# Patient Record
Sex: Male | Born: 1980 | Race: Black or African American | Hispanic: No | Marital: Single | Smoking: Current every day smoker
Health system: Southern US, Community
[De-identification: ages and names within clinical notes are randomized; demographics above are authoritative.]

## PROBLEM LIST (undated history)

## (undated) DIAGNOSIS — R519 Headache, unspecified: Secondary | ICD-10-CM

## (undated) DIAGNOSIS — I10 Essential (primary) hypertension: Secondary | ICD-10-CM

## (undated) DIAGNOSIS — R51 Headache: Secondary | ICD-10-CM

## (undated) DIAGNOSIS — M1990 Unspecified osteoarthritis, unspecified site: Secondary | ICD-10-CM

## (undated) HISTORY — PX: BACK SURGERY: SHX140

---
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
## 2000-04-24 ENCOUNTER — Encounter: Payer: Self-pay | Admitting: Neurosurgery

## 2000-04-24 ENCOUNTER — Ambulatory Visit (HOSPITAL_COMMUNITY): Admission: RE | Admit: 2000-04-24 | Discharge: 2000-04-24 | Payer: Self-pay | Admitting: Neurosurgery

## 2000-06-08 ENCOUNTER — Encounter: Payer: Self-pay | Admitting: Neurosurgery

## 2000-06-08 ENCOUNTER — Ambulatory Visit (HOSPITAL_COMMUNITY): Admission: RE | Admit: 2000-06-08 | Discharge: 2000-06-09 | Payer: Self-pay | Admitting: Neurosurgery

## 2000-07-21 ENCOUNTER — Encounter (HOSPITAL_COMMUNITY): Admission: RE | Admit: 2000-07-21 | Discharge: 2000-08-20 | Payer: Self-pay | Admitting: Neurosurgery

## 2000-12-10 ENCOUNTER — Encounter: Payer: Self-pay | Admitting: Neurosurgery

## 2000-12-10 ENCOUNTER — Ambulatory Visit (HOSPITAL_COMMUNITY): Admission: RE | Admit: 2000-12-10 | Discharge: 2000-12-10 | Payer: Self-pay | Admitting: Neurosurgery

## 2001-07-15 ENCOUNTER — Encounter: Payer: Self-pay | Admitting: Internal Medicine

## 2001-07-15 ENCOUNTER — Ambulatory Visit (HOSPITAL_COMMUNITY): Admission: RE | Admit: 2001-07-15 | Discharge: 2001-07-15 | Payer: Self-pay | Admitting: Internal Medicine

## 2002-09-14 ENCOUNTER — Emergency Department (HOSPITAL_COMMUNITY): Admission: EM | Admit: 2002-09-14 | Discharge: 2002-09-14 | Payer: Self-pay | Admitting: *Deleted

## 2002-09-14 ENCOUNTER — Encounter: Payer: Self-pay | Admitting: *Deleted

## 2002-11-02 ENCOUNTER — Emergency Department (HOSPITAL_COMMUNITY): Admission: EM | Admit: 2002-11-02 | Discharge: 2002-11-02 | Payer: Self-pay | Admitting: Emergency Medicine

## 2004-11-05 ENCOUNTER — Ambulatory Visit (HOSPITAL_COMMUNITY): Admission: RE | Admit: 2004-11-05 | Discharge: 2004-11-05 | Payer: Self-pay | Admitting: Internal Medicine

## 2005-06-10 ENCOUNTER — Ambulatory Visit (HOSPITAL_COMMUNITY): Admission: RE | Admit: 2005-06-10 | Discharge: 2005-06-10 | Payer: Self-pay | Admitting: Internal Medicine

## 2006-02-26 ENCOUNTER — Ambulatory Visit (HOSPITAL_COMMUNITY): Admission: RE | Admit: 2006-02-26 | Discharge: 2006-02-26 | Payer: Self-pay | Admitting: Internal Medicine

## 2006-03-19 ENCOUNTER — Ambulatory Visit (HOSPITAL_BASED_OUTPATIENT_CLINIC_OR_DEPARTMENT_OTHER): Admission: RE | Admit: 2006-03-19 | Discharge: 2006-03-19 | Payer: Self-pay | Admitting: Internal Medicine

## 2006-03-28 ENCOUNTER — Ambulatory Visit: Payer: Self-pay | Admitting: Internal Medicine

## 2009-11-26 ENCOUNTER — Ambulatory Visit (HOSPITAL_COMMUNITY): Admission: RE | Admit: 2009-11-26 | Discharge: 2009-11-26 | Payer: Self-pay | Admitting: Internal Medicine

## 2010-07-25 NOTE — Procedures (Signed)
NAME:  Bryan Robles, BARANOWSKI NO.:  [id number]   MEDICAL RECORD NO.:  [id number]          PATIENT TYPE:  OUT   LOCATION:  SLEEP CENTER                 FACILITY:  [facility]   PHYSICIAN:  Clinton D. Maple Hudson, MD, FCCP, FACPDATE OF BIRTH:  Jul 04, 1980   DATE OF STUDY:  03/19/2006                            NOCTURNAL POLYSOMNOGRAM   REFERRING PHYSICIAN:  Kingsley Callander. Ouida Sills, M.D.   INDICATION FOR STUDY:  Hypersomnia with sleep apnea.  Epworth sleepiness  score 9/24, BMI 27.1, weight 212 pounds.   MEDICATIONS:  Melatonin 5 mg which was taken for this study night.   SLEEP ARCHITECTURE:  Total sleep time 347 minutes with sleep efficiency  88%.  Stage I was 11%, stage II 69%.  Stages III and IV were absent.  REM 205 of total sleep time.  Sleep latency 22 minutes.  REM latency 153  minutes. Awake after sleep onset 27 minutes, arousal index 7.8.  Melatonin was taken at bedtime.   RESPIRATORY DATA:  Apnea/hypotonia index (AHI, RDI) 2.2 obstructive  events per hour which is within normal limits (normal range 0 to 5 per  hour).  There were two central apneas, 7 obstructive apneas, and four  hypotonias. Events were more common while supine (AHI 6.6).  REM AHI 1.8  per hour.  There were insufficient events to qualify for split protocol  CPAP titration on this study night.   OXYGEN DATA:  Moderately loud snoring with oxygen desaturation  transiently to a nadir of 86%.  Mean oxygen saturation through the study  was 95% on room air.   CARDIAC DATA:  Normal sinus rhythm.   MOVEMENT/PARASOMNIA:  Limb jerks were noted but rarely associated with  sleep disturbance.   IMPRESSION/RECOMMENDATIONS:  1. Unremarkable sleep architecture for this study environment after      taking melatonin.  2. Occasional sleep disorder breathing events, AHI 2.2 per hour      (normal range 0 to 5 per hour).  Events were more common while      supine and it may help to sleep off the flat of his back and to      treat  any nasal congestion.  Otherwise consider treating complaints      as insomnia.     Clinton D. Maple Hudson, MD, [facility], FACP  Diplomate, Biomedical engineer of Sleep Medicine  Electronically Signed    CDY/MEDQ  D:  03/28/2006 09:41:11  T:  03/28/2006 12:33:04  Job:  [id number]

## 2010-07-25 NOTE — Op Note (Signed)
[facility]  Patient:    Bryan Robles, Bryan Robles                           MRN: [id number] Proc. Date: 06/08/00 Adm. Date:  06/08/00 Attending:  Reinaldo Meeker, M.D.                           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  POSTOPERATIVE DIAGNOSIS:  Herniated disk L4-5 left.  PROCEDURE:  Left L4-5 interlaminar laminotomy for excision of herniated disk with operative microscope.  SURGEON:  Reinaldo Meeker, M.D.  ASSISTANT:  Donalee Citrin, Jr.  DESCRIPTION OF PROCEDURE:  After being placed in the prone position, the patients back was prepped and draped in the usual sterile fashion.  A localizing x-ray was taken prior to incision to identify the L4-5 level.  A midline incision was made over the spinous process of L4-L5.  Using the Bovie cautery and curet, the incision was carried down to the spinous processes. Subperiosteal dissection was then carried out along the left side of the spinous processes and lamina and the McCullough self-retaining retractor was placed for exposure.  A second x-ray was taken to confirm the approach to the L4-L5 level, and this was correct.  Using the high speed drill, the inferior one third of the L4 lamina and the medial one third of the facet joint were removed.  The drill was then used to remove the superior one third of the L5 lamina.  Residual bone and ligamentum flavum were removed in a piecemeal fashion.  The microscope was then draped, brought into the field and used for the remainder of the case.  Using microdissection technique, the lateral aspect of the thecal sac and L5 nerve root were identified.  Further coagulation was carried out down to the floor of the canal to identify the L4-5 disk which was found to be markedly herniated, directly beneath the nerve root.  After coagulating on the annulus, the annulus was incised with a 15 blade.  Using pituitary rongeurs and curets, very  thorough disk space clean out was carried out.  At the same time, great care was taken to avoid injury to the neural elements and this was successfully done.  At this point inspection was carried out in all directions for any evidence of of residual compression and none could be identified.  Large amounts of irrigation were carried out and bleeding was controlled with antibiotic coagulation and Gelfoam.  The wound was then closed using interrupted Vicryl in the muscle, fascia, subcutaneous and subcuticular tissues and staples on the skin.  A sterile dressing was then applied.  The patient was extubated and taken to the recovery room in stable condition. DD:  06/08/00 TD:  06/08/00 Job: [id number]

## 2011-05-07 ENCOUNTER — Other Ambulatory Visit (HOSPITAL_COMMUNITY): Payer: Self-pay | Admitting: Internal Medicine

## 2011-05-07 DIAGNOSIS — M5412 Radiculopathy, cervical region: Secondary | ICD-10-CM

## 2011-05-08 ENCOUNTER — Ambulatory Visit (HOSPITAL_COMMUNITY)
Admission: RE | Admit: 2011-05-08 | Discharge: 2011-05-08 | Disposition: A | Discharge status: Home / Self Care | Payer: PRIVATE HEALTH INSURANCE | Source: Ambulatory Visit | Attending: Internal Medicine | Admitting: Internal Medicine

## 2011-05-08 DIAGNOSIS — M5412 Radiculopathy, cervical region: Secondary | ICD-10-CM

## 2011-05-08 DIAGNOSIS — M542 Cervicalgia: Secondary | ICD-10-CM | POA: Insufficient documentation

## 2011-05-08 DIAGNOSIS — M25519 Pain in unspecified shoulder: Secondary | ICD-10-CM | POA: Insufficient documentation

## 2011-05-08 DIAGNOSIS — M5020 Other cervical disc displacement, unspecified cervical region: Secondary | ICD-10-CM | POA: Insufficient documentation

## 2011-08-14 ENCOUNTER — Other Ambulatory Visit (HOSPITAL_COMMUNITY): Payer: Self-pay | Admitting: Internal Medicine

## 2011-08-14 DIAGNOSIS — IMO0002 Reserved for concepts with insufficient information to code with codable children: Secondary | ICD-10-CM

## 2011-08-18 ENCOUNTER — Ambulatory Visit (HOSPITAL_COMMUNITY)
Admission: RE | Admit: 2011-08-18 | Discharge: 2011-08-18 | Disposition: A | Discharge status: Home / Self Care | Payer: PRIVATE HEALTH INSURANCE | Source: Ambulatory Visit | Attending: Internal Medicine | Admitting: Internal Medicine

## 2011-08-18 DIAGNOSIS — M47814 Spondylosis without myelopathy or radiculopathy, thoracic region: Secondary | ICD-10-CM | POA: Insufficient documentation

## 2011-08-18 DIAGNOSIS — M546 Pain in thoracic spine: Secondary | ICD-10-CM | POA: Insufficient documentation

## 2011-08-18 DIAGNOSIS — M5124 Other intervertebral disc displacement, thoracic region: Secondary | ICD-10-CM | POA: Insufficient documentation

## 2011-08-18 DIAGNOSIS — IMO0002 Reserved for concepts with insufficient information to code with codable children: Secondary | ICD-10-CM

## 2014-05-11 ENCOUNTER — Other Ambulatory Visit (HOSPITAL_COMMUNITY): Payer: Self-pay | Admitting: Internal Medicine

## 2014-05-11 DIAGNOSIS — M5416 Radiculopathy, lumbar region: Secondary | ICD-10-CM

## 2014-05-18 ENCOUNTER — Ambulatory Visit (HOSPITAL_COMMUNITY)
Admission: RE | Admit: 2014-05-18 | Discharge: 2014-05-18 | Disposition: A | Discharge status: Home / Self Care | Payer: 59 | Source: Ambulatory Visit | Attending: Internal Medicine | Admitting: Internal Medicine

## 2014-05-18 DIAGNOSIS — M5416 Radiculopathy, lumbar region: Secondary | ICD-10-CM | POA: Diagnosis present

## 2014-11-13 ENCOUNTER — Encounter (HOSPITAL_COMMUNITY)
Admission: EM | Disposition: A | Discharge status: Home / Self Care | Payer: Self-pay | Source: Home / Self Care | Attending: Emergency Medicine

## 2014-11-13 ENCOUNTER — Emergency Department (HOSPITAL_COMMUNITY): Payer: 59 | Admitting: Certified Registered"

## 2014-11-13 ENCOUNTER — Ambulatory Visit (HOSPITAL_COMMUNITY): Admit: 2014-11-13 | Payer: Self-pay

## 2014-11-13 ENCOUNTER — Observation Stay (HOSPITAL_COMMUNITY)
Admission: EM | Admit: 2014-11-13 | Discharge: 2014-11-14 | Disposition: A | Discharge status: Home / Self Care | Payer: 59 | Attending: Surgery | Admitting: Surgery

## 2014-11-13 ENCOUNTER — Encounter (HOSPITAL_COMMUNITY): Payer: Self-pay | Admitting: *Deleted

## 2014-11-13 DIAGNOSIS — Z79891 Long term (current) use of opiate analgesic: Secondary | ICD-10-CM | POA: Diagnosis not present

## 2014-11-13 DIAGNOSIS — S41111A Laceration without foreign body of right upper arm, initial encounter: Secondary | ICD-10-CM | POA: Diagnosis present

## 2014-11-13 DIAGNOSIS — Z23 Encounter for immunization: Secondary | ICD-10-CM | POA: Diagnosis not present

## 2014-11-13 DIAGNOSIS — W134XXA Fall from, out of or through window, initial encounter: Secondary | ICD-10-CM | POA: Insufficient documentation

## 2014-11-13 DIAGNOSIS — IMO0002 Reserved for concepts with insufficient information to code with codable children: Secondary | ICD-10-CM

## 2014-11-13 DIAGNOSIS — S51011A Laceration without foreign body of right elbow, initial encounter: Secondary | ICD-10-CM | POA: Diagnosis present

## 2014-11-13 DIAGNOSIS — F17210 Nicotine dependence, cigarettes, uncomplicated: Secondary | ICD-10-CM | POA: Insufficient documentation

## 2014-11-13 DIAGNOSIS — Z79899 Other long term (current) drug therapy: Secondary | ICD-10-CM | POA: Insufficient documentation

## 2014-11-13 DIAGNOSIS — S45311A Laceration of superficial vein at shoulder and upper arm level, right arm, initial encounter: Principal | ICD-10-CM | POA: Insufficient documentation

## 2014-11-13 DIAGNOSIS — S45191A Other specified injury of brachial artery, right side, initial encounter: Secondary | ICD-10-CM | POA: Diagnosis not present

## 2014-11-13 HISTORY — PX: WOUND EXPLORATION: SHX6188

## 2014-11-13 SURGERY — WOUND EXPLORATION
Anesthesia: General | Site: Arm Lower | Laterality: Right

## 2014-11-13 MED ORDER — TETANUS-DIPHTH-ACELL PERTUSSIS 5-2.5-18.5 LF-MCG/0.5 IM SUSP
0.5000 mL | INTRAMUSCULAR | Status: AC
  Administered 2014-11-14: 0.5 mL via INTRAMUSCULAR
  Filled 2014-11-13: qty 0.5

## 2014-11-13 MED ORDER — EPHEDRINE SULFATE 50 MG/ML IJ SOLN
INTRAMUSCULAR | Status: AC
  Filled 2014-11-13: qty 2

## 2014-11-13 MED ORDER — MIDAZOLAM HCL 2 MG/2ML IJ SOLN
INTRAMUSCULAR | Status: AC
  Filled 2014-11-13: qty 4

## 2014-11-13 MED ORDER — SUCCINYLCHOLINE CHLORIDE 20 MG/ML IJ SOLN
INTRAMUSCULAR | Status: DC | PRN
  Administered 2014-11-13: 120 mg via INTRAVENOUS

## 2014-11-13 MED ORDER — PHENYLEPHRINE HCL 10 MG/ML IJ SOLN
INTRAMUSCULAR | Status: DC | PRN
  Administered 2014-11-13: 120 ug via INTRAVENOUS
  Administered 2014-11-13 – 2014-11-14 (×3): 80 ug via INTRAVENOUS

## 2014-11-13 MED ORDER — FENTANYL CITRATE (PF) 100 MCG/2ML IJ SOLN
INTRAMUSCULAR | Status: AC
  Filled 2014-11-13: qty 2

## 2014-11-13 MED ORDER — CEFAZOLIN SODIUM-DEXTROSE 2-3 GM-% IV SOLR
INTRAVENOUS | Status: DC | PRN
  Administered 2014-11-13: 2 g via INTRAVENOUS

## 2014-11-13 MED ORDER — LACTATED RINGERS IV SOLN
INTRAVENOUS | Status: DC | PRN
  Administered 2014-11-13 – 2014-11-14 (×3): via INTRAVENOUS

## 2014-11-13 MED ORDER — GLYCOPYRROLATE 0.2 MG/ML IJ SOLN
INTRAMUSCULAR | Status: AC
  Filled 2014-11-13: qty 2

## 2014-11-13 MED ORDER — LIDOCAINE HCL (CARDIAC) 20 MG/ML IV SOLN
INTRAVENOUS | Status: DC | PRN
  Administered 2014-11-13: 100 mg via INTRAVENOUS

## 2014-11-13 MED ORDER — ONDANSETRON HCL 4 MG/2ML IJ SOLN
INTRAMUSCULAR | Status: AC
  Filled 2014-11-13: qty 10

## 2014-11-13 MED ORDER — THROMBIN 20000 UNITS EX KIT
PACK | CUTANEOUS | Status: AC
  Filled 2014-11-13: qty 1

## 2014-11-13 MED ORDER — FENTANYL CITRATE (PF) 100 MCG/2ML IJ SOLN
INTRAMUSCULAR | Status: DC | PRN
  Administered 2014-11-13: 100 ug via INTRAVENOUS
  Administered 2014-11-13: 50 ug via INTRAVENOUS
  Administered 2014-11-13: 100 ug via INTRAVENOUS

## 2014-11-13 MED ORDER — NEOSTIGMINE METHYLSULFATE 10 MG/10ML IV SOLN
INTRAVENOUS | Status: AC
  Filled 2014-11-13: qty 1

## 2014-11-13 MED ORDER — PROPOFOL 10 MG/ML IV BOLUS
INTRAVENOUS | Status: AC
  Filled 2014-11-13: qty 20

## 2014-11-13 MED ORDER — SODIUM CHLORIDE 0.9 % IJ SOLN
INTRAMUSCULAR | Status: AC
  Filled 2014-11-13: qty 10

## 2014-11-13 MED ORDER — ETOMIDATE 2 MG/ML IV SOLN
INTRAVENOUS | Status: AC
  Filled 2014-11-13: qty 10

## 2014-11-13 MED ORDER — THROMBIN 20000 UNITS EX SOLR
CUTANEOUS | Status: AC
  Filled 2014-11-13: qty 20000

## 2014-11-13 MED ORDER — FENTANYL CITRATE (PF) 100 MCG/2ML IJ SOLN
100.0000 ug | Freq: Once | INTRAMUSCULAR | Status: AC
  Administered 2014-11-13: 100 ug via INTRAVENOUS

## 2014-11-13 MED ORDER — LIDOCAINE HCL (CARDIAC) 20 MG/ML IV SOLN
INTRAVENOUS | Status: AC
  Filled 2014-11-13: qty 15

## 2014-11-13 MED ORDER — MIDAZOLAM HCL 5 MG/5ML IJ SOLN
INTRAMUSCULAR | Status: DC | PRN
  Administered 2014-11-13: 2 mg via INTRAVENOUS

## 2014-11-13 MED ORDER — PROPOFOL 10 MG/ML IV BOLUS
INTRAVENOUS | Status: DC | PRN
  Administered 2014-11-13: 200 mg via INTRAVENOUS

## 2014-11-13 MED ORDER — ROCURONIUM BROMIDE 50 MG/5ML IV SOLN
INTRAVENOUS | Status: AC
  Filled 2014-11-13: qty 1

## 2014-11-13 MED ORDER — FENTANYL CITRATE (PF) 250 MCG/5ML IJ SOLN
INTRAMUSCULAR | Status: AC
  Filled 2014-11-13: qty 5

## 2014-11-13 MED ORDER — SUCCINYLCHOLINE CHLORIDE 20 MG/ML IJ SOLN
INTRAMUSCULAR | Status: AC
  Filled 2014-11-13: qty 1

## 2014-11-13 SURGICAL SUPPLY — 36 items
ARMBAND PINK RESTRICT EXTREMIT (MISCELLANEOUS) ×4 IMPLANT
BANDAGE ELASTIC 4 VELCRO ST LF (GAUZE/BANDAGES/DRESSINGS) ×4 IMPLANT
BNDG GAUZE ELAST 4 BULKY (GAUZE/BANDAGES/DRESSINGS) ×4 IMPLANT
CANISTER SUCTION 2500CC (MISCELLANEOUS) ×4 IMPLANT
CATH EMB 4FR 80CM (CATHETERS) ×4 IMPLANT
CLIP TI MEDIUM 6 (CLIP) ×4 IMPLANT
CLIP TI WIDE RED SMALL 6 (CLIP) ×4 IMPLANT
DRAPE INCISE IOBAN 66X45 STRL (DRAPES) ×4 IMPLANT
ELECT REM PT RETURN 9FT ADLT (ELECTROSURGICAL) ×4
ELECTRODE REM PT RTRN 9FT ADLT (ELECTROSURGICAL) ×2 IMPLANT
GAUZE SPONGE 4X4 12PLY STRL (GAUZE/BANDAGES/DRESSINGS) ×4 IMPLANT
GEL ULTRASOUND 20GR AQUASONIC (MISCELLANEOUS) IMPLANT
GLOVE BIOGEL PI IND STRL 6.5 (GLOVE) ×2 IMPLANT
GLOVE BIOGEL PI IND STRL 7.5 (GLOVE) ×6 IMPLANT
GLOVE BIOGEL PI INDICATOR 6.5 (GLOVE) ×2
GLOVE BIOGEL PI INDICATOR 7.5 (GLOVE) ×6
GLOVE ECLIPSE 7.0 STRL STRAW (GLOVE) ×8 IMPLANT
GLOVE SURG SS PI 6.5 STRL IVOR (GLOVE) ×4 IMPLANT
GLOVE SURG SS PI 7.5 STRL IVOR (GLOVE) ×12 IMPLANT
GOWN STRL REUS W/ TWL LRG LVL3 (GOWN DISPOSABLE) ×4 IMPLANT
GOWN STRL REUS W/ TWL XL LVL3 (GOWN DISPOSABLE) ×2 IMPLANT
GOWN STRL REUS W/TWL LRG LVL3 (GOWN DISPOSABLE) ×4
GOWN STRL REUS W/TWL XL LVL3 (GOWN DISPOSABLE) ×2
HEMOSTAT SNOW SURGICEL 2X4 (HEMOSTASIS) IMPLANT
KIT BASIN OR (CUSTOM PROCEDURE TRAY) ×4 IMPLANT
KIT ROOM TURNOVER OR (KITS) ×4 IMPLANT
LIQUID BAND (GAUZE/BANDAGES/DRESSINGS) ×4 IMPLANT
NS IRRIG 1000ML POUR BTL (IV SOLUTION) ×4 IMPLANT
PACK CV ACCESS (CUSTOM PROCEDURE TRAY) ×4 IMPLANT
PAD ARMBOARD 7.5X6 YLW CONV (MISCELLANEOUS) ×8 IMPLANT
SUT PROLENE 6 0 BV (SUTURE) ×12 IMPLANT
SUT VIC AB 3-0 SH 27 (SUTURE) ×2
SUT VIC AB 3-0 SH 27X BRD (SUTURE) ×2 IMPLANT
SUT VICRYL 4-0 PS2 18IN ABS (SUTURE) IMPLANT
UNDERPAD 30X30 INCONTINENT (UNDERPADS AND DIAPERS) ×4 IMPLANT
WATER STERILE IRR 1000ML POUR (IV SOLUTION) ×4 IMPLANT

## 2014-11-13 NOTE — Anesthesia Procedure Notes (Signed)
Procedure Name: Intubation Date/Time: 11/13/2014 11:26 PM Performed by: Arlice Colt B Pre-anesthesia Checklist: Patient identified, Emergency Drugs available, Suction available, Patient being monitored and Timeout performed Patient Re-evaluated:Patient Re-evaluated prior to inductionOxygen Delivery Method: Circle system utilized Preoxygenation: Pre-oxygenation with 100% oxygen Intubation Type: IV induction and Rapid sequence Laryngoscope Size: Mac and 3 Grade View: Grade II Tube type: Oral Tube size: 7.5 mm Number of attempts: 1 Airway Equipment and Method: Stylet Placement Confirmation: ETT inserted through vocal cords under direct vision,  positive ETCO2 and breath sounds checked- equal and bilateral Secured at: 23 cm Tube secured with: Tape Dental Injury: Teeth and Oropharynx as per pre-operative assessment

## 2014-11-13 NOTE — ED Notes (Signed)
Paged on call Vascular MD through [facility].

## 2014-11-13 NOTE — ED Notes (Signed)
Labs results i-stat results given to dr Ellwood Sayers. No further orders given. Labs were incorrectly obtained or drawn

## 2014-11-13 NOTE — ED Notes (Signed)
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

## 2014-11-13 NOTE — ED Provider Notes (Signed)
CSN: [id number]     Arrival date & time 11/13/14  2130 History  This chart was scribed for Tilden Fossa, MD by Budd Palmer, ED Scribe. This patient was seen in room APA02/APA02 and the patient's care was started at 9:30 PM.    Chief Complaint  Patient presents with  . Extremity Laceration   The history is provided by the patient. No language interpreter was used.   HPI Comments: Bryan Robles is a 34 y.o. male smoker at 1 ppd who presents to the Emergency Department complaining of a laceration to the right arm sustained when he fell through a window frame about 20-30 minutes ago. 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 He notes excessive bleeding of the injury. He denies any other injuries. He reports a PMHx of muscle spasms, for which he takes medication. He notes a PSHx of back surgery. Pt states he is not currently on any blood thinners and has NKDA. He notes no other medical problems.  No past medical history on file. Past Surgical History  Procedure Laterality Date  . Back surgery     No family history on file. Social History  Substance Use Topics  . Smoking status: Current Every Day Smoker -- 1.00 packs/day  . Smokeless tobacco: Not on file  . Alcohol Use: No    Review of Systems  Skin: Positive for wound.  All other systems reviewed and are negative.   Allergies  Review of patient's allergies indicates no known allergies.  Home Medications   Prior to Admission medications   Medication Sig Start Date End Date Taking? Authorizing Provider  cyclobenzaprine (FLEXERIL) 10 MG tablet  11/09/14   Historical Provider, MD  diazepam (VALIUM) 5 MG tablet  08/28/14   Historical Provider, MD  etodolac (LODINE) 400 MG tablet  09/24/14   Historical Provider, MD  HYDROcodone-acetaminophen [facility]) 10-325 MG per tablet  11/05/14   Historical Provider, MD  losartan (COZAAR) 100 MG tablet  09/20/14   Historical Provider, MD  oxyCODONE-acetaminophen (PERCOCET)  10-325 MG per tablet  08/28/14   Historical Provider, MD  verapamil (CALAN-SR) 240 MG CR tablet  09/20/14   Historical Provider, MD   There were no vitals taken for this visit. Physical Exam  Constitutional: He is oriented to person, place, and time. He appears well-developed. He appears distressed.  HENT:  Head: Normocephalic and atraumatic.  Cardiovascular: Normal rate and regular rhythm.   Pulmonary/Chest: Effort normal. No respiratory distress.  Abdominal: Soft. There is no tenderness.  Musculoskeletal:  There is a large laceration to the right antecubital fossa. It is approximately 3-4 cm in length and triangular in shape. The wound is deep with pulsatile bleeding from the base of the wounds. 2+ radial pulse on the right upper extremity.  Neurological: He is alert and oriented to person, place, and time.  Sensation to light touch intact throughout all digits. Wiggles all digits of the right upper extremity.  Skin: Skin is warm and dry.  Psychiatric: He has a normal mood and affect. His behavior is normal.  Nursing note and vitals reviewed.   ED Course  Procedures     COORDINATION OF CARE: 10:03 PM - Discussed plans to order fentanyl and control the bleeding. Will send pt for vascular surgery via EMS. Pt advised of plan for treatment and pt agrees.  Labs Review Labs Reviewed - No data to display  Imaging Review No results found. I have personally reviewed and evaluated these images  and lab results as part of my medical decision-making.   EKG Interpretation None      CRITICAL CARE Performed by: Tilden Fossa, MD Total critical care time: 30 minutes  Critical care time was exclusive of separately billable procedures and treating other patients. Critical care was necessary to treat or prevent imminent or life-threatening deterioration. Critical care was time spent personally by me on the following activities: development of treatment plan with patient and/or surrogate as  well as nursing, discussions with consultants, evaluation of patient's response to treatment, examination of patient, obtaining history from patient or surrogate, ordering and performing treatments and interventions, ordering and review of laboratory studies, ordering and review of radiographic studies, pulse oximetry and re-evaluation of patient's condition.  MDM   Final diagnoses:  Laceration   Patient here for evaluation of arm laceration with pulsatile bleeding in the emergency department. Attempted to maintain hemostasis with blood pressure cuff without success. A second attempt was performed and hemostasis with application of tourniquet to the right upper extremity. Patient had persistent bleeding with tourniquet in place that limits full evaluation of the wound. On repeat examinations patient with recurrent pooling and pulsatile bleeding that is concerning for arterial injury despite 2+ radial pulse. Bleeding can be controlled with direct pressure to the wound.  Discussed with Dr. Myra Gianotti with vascular surgery concern for potential arterial injury given pulsatile flow and persistent bleeding. He accepts the patient in transfer.  I stat chem 8 ordered, results appear to be drawn through IV fluids and incorrect (glu 25, potassium less than 2, sodium 156, hgb 3). EMS arrived for transport at time that I stat results were available. No additional labs drawn.  I personally performed the services described in this documentation, which was scribed in my presence. The recorded information has been reviewed and is accurate.   Tilden Fossa, MD 11/14/14 [phone]

## 2014-11-13 NOTE — ED Notes (Signed)
MD at the bedside, pressure applied

## 2014-11-13 NOTE — ED Notes (Signed)
Attempted to call report  To OR but  There was no answer

## 2014-11-13 NOTE — Anesthesia Preprocedure Evaluation (Signed)
Anesthesia Evaluation  Patient identified by MRN, date of birth, ID band Patient awake  General Assessment Comment:Straight to OR c emergency bleeding from laceration to arm.  Airway Mallampati: I   Neck ROM: Full    Dental  (+) Teeth Intact   Pulmonary Current Smoker,    breath sounds clear to auscultation       Cardiovascular  Rhythm:Regular     Neuro/Psych    GI/Hepatic   Endo/Other    Renal/GU      Musculoskeletal   Abdominal   Peds  Hematology   Anesthesia Other Findings   Reproductive/Obstetrics                             Anesthesia Physical Anesthesia Plan  ASA: II and emergent  Anesthesia Plan: General   Post-op Pain Management:    Induction: Intravenous  Airway Management Planned: Oral ETT  Additional Equipment:   Intra-op Plan:   Post-operative Plan:   Informed Consent: I have reviewed the patients History and Physical, chart, labs and discussed the procedure including the risks, benefits and alternatives for the proposed anesthesia with the patient or authorized representative who has indicated his/her understanding and acceptance.   Dental advisory given  Plan Discussed with: CRNA and Surgeon  Anesthesia Plan Comments:         Anesthesia Quick Evaluation

## 2014-11-14 ENCOUNTER — Telehealth: Payer: Self-pay | Admitting: Surgery

## 2014-11-14 ENCOUNTER — Encounter (HOSPITAL_COMMUNITY): Payer: Self-pay | Admitting: Surgery

## 2014-11-14 DIAGNOSIS — S41111A Laceration without foreign body of right upper arm, initial encounter: Secondary | ICD-10-CM | POA: Diagnosis present

## 2014-11-14 LAB — BASIC METABOLIC PANEL
ANION GAP: 8 (ref 5–15)
BUN: 12 mg/dL (ref 6–20)
CALCIUM: 8.1 mg/dL — AB (ref 8.9–10.3)
CO2: 22 mmol/L (ref 22–32)
Chloride: 107 mmol/L (ref 101–111)
Creatinine, Ser: 1.11 mg/dL (ref 0.61–1.24)
Glucose, Bld: 80 mg/dL (ref 65–99)
POTASSIUM: 4.2 mmol/L (ref 3.5–5.1)
SODIUM: 137 mmol/L (ref 135–145)

## 2014-11-14 LAB — CBC
HCT: 32.6 % — ABNORMAL LOW (ref 39.0–52.0)
Hemoglobin: 10.7 g/dL — ABNORMAL LOW (ref 13.0–17.0)
MCH: 34.6 pg — ABNORMAL HIGH (ref 26.0–34.0)
MCHC: 32.8 g/dL (ref 30.0–36.0)
MCV: 105.5 fL — AB (ref 78.0–100.0)
PLATELETS: 208 10*3/uL (ref 150–400)
RBC: 3.09 MIL/uL — AB (ref 4.22–5.81)
RDW: 13.1 % (ref 11.5–15.5)
WBC: 10 10*3/uL (ref 4.0–10.5)

## 2014-11-14 LAB — POCT I-STAT 4, (NA,K, GLUC, HGB,HCT)
Glucose, Bld: 86 mg/dL (ref 65–99)
HCT: 36 % — ABNORMAL LOW (ref 39.0–52.0)
HEMOGLOBIN: 12.2 g/dL — AB (ref 13.0–17.0)
POTASSIUM: 4.4 mmol/L (ref 3.5–5.1)
SODIUM: 137 mmol/L (ref 135–145)

## 2014-11-14 LAB — TYPE AND SCREEN
ABO/RH(D): A POS
ANTIBODY SCREEN: NEGATIVE

## 2014-11-14 LAB — ABO/RH: ABO/RH(D): A POS

## 2014-11-14 MED ORDER — DEXTROSE 5 % IV SOLN
1.5000 g | Freq: Two times a day (BID) | INTRAVENOUS | Status: DC
  Filled 2014-11-14 (×2): qty 1.5

## 2014-11-14 MED ORDER — ONDANSETRON HCL 4 MG/2ML IJ SOLN: 4.0000 mg | Freq: Four times a day (QID) | INTRAMUSCULAR | Status: DC | PRN

## 2014-11-14 MED ORDER — BACITRACIN ZINC 500 UNIT/GM EX OINT
TOPICAL_OINTMENT | CUTANEOUS | Status: AC
  Filled 2014-11-14: qty 28.35

## 2014-11-14 MED ORDER — OXYCODONE HCL 10 MG PO TABS: 10.0000 mg | ORAL_TABLET | ORAL | Status: DC | PRN

## 2014-11-14 MED ORDER — ACETAMINOPHEN 325 MG RE SUPP: 325.0000 mg | RECTAL | Status: DC | PRN

## 2014-11-14 MED ORDER — POTASSIUM CHLORIDE CRYS ER 20 MEQ PO TBCR: 20.0000 meq | EXTENDED_RELEASE_TABLET | Freq: Once | ORAL | Status: DC

## 2014-11-14 MED ORDER — HYDROMORPHONE HCL 1 MG/ML IJ SOLN
INTRAMUSCULAR | Status: DC
Start: 2014-11-14 — End: 2014-11-14
  Filled 2014-11-14: qty 1

## 2014-11-14 MED ORDER — LABETALOL HCL 5 MG/ML IV SOLN: 10.0000 mg | INTRAVENOUS | Status: DC | PRN

## 2014-11-14 MED ORDER — OXYCODONE-ACETAMINOPHEN 5-325 MG PO TABS
1.0000 | ORAL_TABLET | ORAL | Status: DC | PRN
Start: 2014-11-14 — End: 2014-11-14
  Administered 2014-11-14: 2 via ORAL

## 2014-11-14 MED ORDER — CEPHALEXIN 500 MG PO CAPS: 500.0000 mg | ORAL_CAPSULE | Freq: Four times a day (QID) | ORAL | Status: DC

## 2014-11-14 MED ORDER — SENNA 8.6 MG PO TABS
1.0000 | ORAL_TABLET | Freq: Two times a day (BID) | ORAL | Status: DC
  Administered 2014-11-14: 8.6 mg via ORAL
  Filled 2014-11-14 (×3): qty 1

## 2014-11-14 MED ORDER — PROMETHAZINE HCL 25 MG/ML IJ SOLN: 6.2500 mg | INTRAMUSCULAR | Status: DC | PRN

## 2014-11-14 MED ORDER — MORPHINE SULFATE (PF) 2 MG/ML IV SOLN
2.0000 mg | INTRAVENOUS | Status: DC | PRN
Start: 2014-11-14 — End: 2014-11-14
  Filled 2014-11-14: qty 2

## 2014-11-14 MED ORDER — ACETAMINOPHEN 325 MG PO TABS: 325.0000 mg | ORAL_TABLET | ORAL | Status: DC | PRN

## 2014-11-14 MED ORDER — HYDROMORPHONE HCL 1 MG/ML IJ SOLN
0.2500 mg | INTRAMUSCULAR | Status: DC | PRN
  Administered 2014-11-14 (×4): 0.5 mg via INTRAVENOUS

## 2014-11-14 MED ORDER — GUAIFENESIN-DM 100-10 MG/5ML PO SYRP: 15.0000 mL | ORAL_SOLUTION | ORAL | Status: DC | PRN

## 2014-11-14 MED ORDER — OXYCODONE-ACETAMINOPHEN 5-325 MG PO TABS
ORAL_TABLET | ORAL | Status: DC
Start: 2014-11-14 — End: 2014-11-14
  Filled 2014-11-14: qty 2

## 2014-11-14 MED ORDER — OXYCODONE HCL 5 MG PO TABS
10.0000 mg | ORAL_TABLET | ORAL | Status: DC | PRN
  Administered 2014-11-14 (×2): 10 mg via ORAL
  Filled 2014-11-14 (×2): qty 2

## 2014-11-14 MED ORDER — CYCLOBENZAPRINE HCL 10 MG PO TABS
10.0000 mg | ORAL_TABLET | Freq: Three times a day (TID) | ORAL | Status: DC | PRN
  Filled 2014-11-14: qty 1

## 2014-11-14 MED ORDER — HYDROMORPHONE HCL 1 MG/ML IJ SOLN
INTRAMUSCULAR | Status: AC
  Filled 2014-11-14: qty 1

## 2014-11-14 MED ORDER — SODIUM CHLORIDE 0.9 % IV SOLN
INTRAVENOUS | Status: DC
  Administered 2014-11-14: 03:00:00 via INTRAVENOUS

## 2014-11-14 MED ORDER — 0.9 % SODIUM CHLORIDE (POUR BTL) OPTIME
TOPICAL | Status: DC | PRN
  Administered 2014-11-14: 1000 mL

## 2014-11-14 MED ORDER — ALUM & MAG HYDROXIDE-SIMETH 200-200-20 MG/5ML PO SUSP: 15.0000 mL | ORAL | Status: DC | PRN

## 2014-11-14 MED ORDER — METOPROLOL TARTRATE 1 MG/ML IV SOLN: 2.0000 mg | INTRAVENOUS | Status: DC | PRN

## 2014-11-14 MED ORDER — HYDRALAZINE HCL 20 MG/ML IJ SOLN: 5.0000 mg | INTRAMUSCULAR | Status: DC | PRN

## 2014-11-14 MED ORDER — PANTOPRAZOLE SODIUM 40 MG PO TBEC
40.0000 mg | DELAYED_RELEASE_TABLET | Freq: Every day | ORAL | Status: DC
  Administered 2014-11-14: 40 mg via ORAL
  Filled 2014-11-14: qty 1

## 2014-11-14 MED ORDER — VERAPAMIL HCL ER 240 MG PO TBCR
240.0000 mg | EXTENDED_RELEASE_TABLET | Freq: Every day | ORAL | Status: DC
  Filled 2014-11-14: qty 1

## 2014-11-14 MED ORDER — PHENOL 1.4 % MT LIQD: 1.0000 | OROMUCOSAL | Status: DC | PRN

## 2014-11-14 MED ORDER — MICROFIBRILLAR COLL HEMOSTAT EX PADS
MEDICATED_PAD | CUTANEOUS | Status: DC | PRN
  Administered 2014-11-14: 1 via TOPICAL

## 2014-11-14 MED ORDER — LOSARTAN POTASSIUM 50 MG PO TABS
100.0000 mg | ORAL_TABLET | Freq: Every day | ORAL | Status: DC
  Administered 2014-11-14: 100 mg via ORAL
  Filled 2014-11-14 (×2): qty 2

## 2014-11-14 MED ORDER — EPHEDRINE SULFATE 50 MG/ML IJ SOLN
INTRAMUSCULAR | Status: DC | PRN
  Administered 2014-11-14: 10 mg via INTRAVENOUS

## 2014-11-14 MED ORDER — MAGNESIUM HYDROXIDE 400 MG/5ML PO SUSP: 30.0000 mL | Freq: Every day | ORAL | Status: DC | PRN

## 2014-11-14 MED ORDER — HYDROMORPHONE HCL 1 MG/ML IJ SOLN
0.2500 mg | INTRAMUSCULAR | Status: DC | PRN
  Administered 2014-11-14 (×2): 0.5 mg via INTRAVENOUS

## 2014-11-14 MED ORDER — SODIUM CHLORIDE 0.9 % IR SOLN
Status: DC | PRN
  Administered 2014-11-14: 500 mL

## 2014-11-14 NOTE — Anesthesia Postprocedure Evaluation (Signed)
  Anesthesia Post-op Note  Patient: Bryan Robles  Procedure(s) Performed: Procedure(s): WOUND EXPLORATION of Right Arm and Ligation of superficial vein. (Right)  Patient Location: PACU  Anesthesia Type:General  Level of Consciousness: awake and sedated  Airway and Oxygen Therapy: Patient Spontanous Breathing  Post-op Pain: mild  Post-op Assessment: Post-op Vital signs reviewed              Post-op Vital Signs: stable  Last Vitals:  Filed Vitals:   11/14/14 0145  BP: 132/61  Pulse: 84  Temp: 36.8 C  Resp: 17    Complications: No apparent anesthesia complications

## 2014-11-14 NOTE — Op Note (Signed)
    Patient name: Bryan Robles MRN: [id number] DOB: 07-22-1980 Sex: male  11/13/2014 Pre-operative Diagnosis: Right brachial artery injury Post-operative diagnosis:  Superficial venous transection Surgeon:  Durene Cal Assistants:  Lianne Cure Procedure:   #1: Exploration of right arm   #2: Ligation of superficial vein  Anesthesia:  Gen. Blood Loss:  See anesthesia record Specimens:  None  Findings:  The patient had complete transection of a superficial vein which had significant bleeding with the tourniquet was let down.  This was ligated.  The brachial artery and median nerve were fully protected.  There was muscle bleeding which was controlled with cautery  Indications:  The patient presented in transfer from the [facility] emergency department secondary to a laceration injury to the right arm which was thought to be a brachial artery injury given the amount of bleeding at the scene.  The tourniquet was in place upon arrival.  He was admitted and stable.  He was taken emergently to the operating room.  Procedure:  The patient was identified in the holding area and taken to [facility] OR ROOM 11  The patient was then placed supine on the table. general anesthesia was administered.  The patient was prepped and draped in the usual sterile fashion.  A time out was called and antibiotics were administered.  A tourniquet was placed just above the existing tourniquet and inflated to 250 mmHg pressure and the outside tourniquet was removed so that the arm could be prepped properly.  Once the arm was prepped and draped sterilely I explored the wound and found a superficial vein which had been transected.  I placed a right angle clamp on the vein and then let the tourniquet down.  There was no significant bleeding.  When I removed the clamp there was fair amount of bleeding coming from this vein.  The vein was then ligated on the proximal and distal end with 2-0 silk ties.  I then explored the wound.  There  were several areas from the muscle bellies that were bleeding.  These were controlled with metal clips and cautery.  The brachial artery and median nerve were not exposed from the injury.  I used a hand-held Doppler.  The patient had excellent radial and ulnar signals.  At this point the wound was loosely reapproximated with interrupted 20 vertical mattress sutures and sterile dressings were applied.   Disposition:  To PACU in stable condition.   Juleen China, M.D. [facility] [place] Office: [phone] Pager:  [phone]

## 2014-11-14 NOTE — Telephone Encounter (Signed)
The home and cell # listed were both wrong #'s- the work phone is disconnected. I have mailed a letter in hopes of reaching him before his appt date, dpm

## 2014-11-14 NOTE — Progress Notes (Signed)
Pt claims to be an 8/10 pain but drifts off to sleep.  Will transport pt to room.

## 2014-11-14 NOTE — H&P (Signed)
         History and Physical  Patient name: Bryan Robles MRN: [id number] DOB: Oct 12, 1980 Sex: male   Reason for Admission:  Chief Complaint  Patient presents with  . Extremity Laceration    HISTORY OF PRESENT ILLNESS: This is a 34 year old gentleman who presented to the [facility] emergency department following a laceration to the right arm which was sustained when he fell through a window.  He had significant bleeding per report.  A tourniquet was applied.  [facility].  Upon arrival he did not complain of numbness or weakness in his hand.  The tourniquet was in place.  He is hemodynamically stable.  History reviewed. No pertinent past medical history.  Past Surgical History  Procedure Laterality Date  . Back surgery      Social History   Social History  . Marital Status: Married    Spouse Name: N/A  . Number of Children: N/A  . Years of Education: N/A   Occupational History  . Not on file.   Social History Main Topics  . Smoking status: Current Every Day Smoker -- 1.00 packs/day  . Smokeless tobacco: Not on file  . Alcohol Use: No  . Drug Use: No  . Sexual Activity: Not on file   Other Topics Concern  . Not on file   Social History Narrative  . No narrative on file    History reviewed. No pertinent family history.  Allergies as of 11/13/2014  . (No Known Allergies)    No current facility-administered medications on file prior to encounter.   No current outpatient prescriptions on file prior to encounter.     REVIEW OF SYSTEMS: See history of present illness, otherwise negative  PHYSICAL EXAMINATION: General: The patient appears their stated age.  Vital signs are There were no vitals taken for this visit. HEENT:  No gross abnormalities Pulmonary: Respirations are non-labored Abdomen: Soft and non-tender with. Musculoskeletal: There are no major deformities.   Neurologic: No focal weakness or paresthesias are detected, Skin: Laceration  to right arm.  Muscle was visible.  Tourniquet was in place.  Minimal bleeding with the tourniquet. Psychiatric: The patient has normal affect. Cardiovascular: There is a regular rate and rhythm without significant murmur appreciated.  Palpable right radial pulse   Assessment:  Laceration to right arm Plan: The patient has had significant blood loss per report.  This is a presumed right brachial artery injury.  He will be taken emergently to the operating room.  I discussed with the family the possibility of primary repair of his brachial artery versus interposition graft.  He does not appear to have a nerve injury at this time although assessment is somewhat limited given the fact that his tourniquet has been in place for approximately one hour.     Jorge Ny, M.D. [facility] [place] Office: [phone] Pager:  [phone]

## 2014-11-14 NOTE — Progress Notes (Signed)
Orthopedic Tech Progress Note Patient Details:  Bryan Robles Oct 25, 1980 [id number]  Ortho Devices Type of Ortho Device: Arm sling Ortho Device/Splint Location: rue Ortho Device/Splint Interventions: Application   Bryan Robles 11/14/2014, 8:16 AM

## 2014-11-14 NOTE — Transfer of Care (Signed)
Immediate Anesthesia Transfer of Care Note  Patient: Bryan Robles  Procedure(s) Performed: Procedure(s): WOUND EXPLORATION of Right Arm and Ligation of superficial vein. (Right)  Patient Location: PACU  Anesthesia Type:General  Level of Consciousness: awake, alert  and patient cooperative  Airway & Oxygen Therapy: Patient Spontanous Breathing and Patient connected to nasal cannula oxygen  Post-op Assessment: Report given to RN and Post -op Vital signs reviewed and stable  Post vital signs: Reviewed and stable  Last Vitals:  Filed Vitals:   11/14/14 0030  Temp: 36.8 C    Complications: No apparent anesthesia complications

## 2014-11-14 NOTE — Telephone Encounter (Signed)
-----   Message from Sharee Pimple, RN sent at 11/14/2014  9:04 AM EDT ----- Regarding: Schedule   ----- Message -----    From: Lars Mage, PA-C    Sent: 11/14/2014   7:00 AM      To: Vvs Charge Pool  F/u with [facility] in 1 week from [place]. Repair laceration to right arm

## 2014-11-15 NOTE — Discharge Summary (Signed)
[facility] Discharge Summary   Patient ID:  Bryan Robles MRN: [id number] DOB/AGE: May 29, 1980 34 y.o.  Admit date: 11/13/2014 Discharge date: 11/14/2014 Date of Surgery: 11/13/2014 - 11/14/2014 Surgeon: Surgeon(s): Nada Libman, MD  Admission Diagnosis: Laceration [T14.8]  Discharge Diagnoses:  Laceration [T14.8]  Secondary Diagnoses: History reviewed. No pertinent past medical history.  Procedure(s): WOUND EXPLORATION of Right Arm and Ligation of superficial vein.  Discharged Condition: good  HPI: This is a 34 year old gentleman who presented to the [facility] emergency department following a laceration to the right arm which was sustained when he fell through a window. He had significant bleeding per report. A tourniquet was applied. [facility] transferred to [facility]. Upon arrival he did not complain of numbness or weakness in his hand. The tourniquet was in place. He is hemodynamically stable.  History reviewed. No pertinent past medical history.   Hospital Course:  Bryan Robles is a 34 y.o. male is S/P { Procedure(s): WOUND EXPLORATION of Right Arm and Ligation of superficial vein. POD#1  Stable with right hand N/V/M intact CC: pain at injury site.  No active bleeding clean dry dressing placed over injury site. Plan elevation sling for comfort f/u in office in 1 week for incision check.  Discharged on keflex 500 mg QID for 7 days.   Significant Diagnostic Studies: CBC Lab Results  Component Value Date   WBC 10.0 11/14/2014   HGB 10.7* 11/14/2014   HCT 32.6* 11/14/2014   MCV 105.5* 11/14/2014   PLT 208 11/14/2014    BMET    Component Value Date/Time   NA 137 11/14/2014 0100   K 4.2 11/14/2014 0100   CL 107 11/14/2014 0100   CO2 22 11/14/2014 0100   GLUCOSE 80 11/14/2014 0100   BUN 12 11/14/2014 0100   CREATININE 1.11 11/14/2014 0100   CALCIUM 8.1* 11/14/2014 0100   GFRNONAA >60 11/14/2014 0100   GFRAA >60 11/14/2014 0100   COAG No results  found for: INR, PROTIME   Disposition:  Discharge to :Home Discharge Instructions    Call MD for:  redness, tenderness, or signs of infection (pain, swelling, bleeding, redness, odor or green/yellow discharge around incision site)    Complete by:  As directed      Call MD for:  severe or increased pain, loss or decreased feeling  in affected limb(s)    Complete by:  As directed      Call MD for:  temperature >100.5    Complete by:  As directed      Discharge instructions    Complete by:  As directed   You may wash your arm in 48 hours.  Dry dressing daily.     Driving Restrictions    Complete by:  As directed   No driving while on narcotics     Lifting restrictions    Complete by:  As directed   No lifting for 6 weeks     Resume previous diet    Complete by:  As directed             Medication List    TAKE these medications        cephALEXin 500 MG capsule  Commonly known as:  KEFLEX  Take 1 capsule (500 mg total) by mouth 4 (four) times daily.     cyclobenzaprine 10 MG tablet  Commonly known as:  FLEXERIL     diazepam 5 MG tablet  Commonly known as:  VALIUM     etodolac  400 MG tablet  Commonly known as:  LODINE     HYDROcodone-acetaminophen 10-325 MG per tablet  Commonly known as:  NORCO     losartan 100 MG tablet  Commonly known as:  COZAAR     Oxycodone HCl 10 MG Tabs  Take 1 tablet (10 mg total) by mouth every 4 (four) hours as needed (pain).     oxyCODONE-acetaminophen 10-325 MG per tablet  Commonly known as:  PERCOCET     verapamil 240 MG CR tablet  Commonly known as:  CALAN-SR       Verbal and written Discharge instructions given to the patient. Wound care per Discharge AVS     Follow-up Information    Follow up with Durene Cal, MD In 1 week.   Specialties:  Vascular Surgery, Cardiology   Why:  sent message to the office   Contact information:   [address] [place] [address] [phone]       Signed: Clinton Gallant  [facility] 11/15/2014, 9:13 AM

## 2014-11-17 ENCOUNTER — Emergency Department (HOSPITAL_COMMUNITY): Payer: 59

## 2014-11-17 ENCOUNTER — Emergency Department (HOSPITAL_COMMUNITY)
Admission: EM | Admit: 2014-11-17 | Discharge: 2014-11-17 | Disposition: A | Discharge status: Home / Self Care | Payer: 59 | Attending: Emergency Medicine | Admitting: Emergency Medicine

## 2014-11-17 ENCOUNTER — Encounter (HOSPITAL_COMMUNITY): Payer: Self-pay | Admitting: Emergency Medicine

## 2014-11-17 DIAGNOSIS — R079 Chest pain, unspecified: Secondary | ICD-10-CM | POA: Insufficient documentation

## 2014-11-17 DIAGNOSIS — Z792 Long term (current) use of antibiotics: Secondary | ICD-10-CM | POA: Insufficient documentation

## 2014-11-17 DIAGNOSIS — R51 Headache: Secondary | ICD-10-CM | POA: Diagnosis not present

## 2014-11-17 DIAGNOSIS — R0602 Shortness of breath: Secondary | ICD-10-CM | POA: Diagnosis not present

## 2014-11-17 DIAGNOSIS — R202 Paresthesia of skin: Secondary | ICD-10-CM | POA: Diagnosis not present

## 2014-11-17 DIAGNOSIS — M545 Low back pain: Secondary | ICD-10-CM | POA: Insufficient documentation

## 2014-11-17 DIAGNOSIS — Z720 Tobacco use: Secondary | ICD-10-CM | POA: Diagnosis not present

## 2014-11-17 LAB — BASIC METABOLIC PANEL
Anion gap: 8 (ref 5–15)
BUN: 14 mg/dL (ref 6–20)
CALCIUM: 8.7 mg/dL — AB (ref 8.9–10.3)
CO2: 28 mmol/L (ref 22–32)
CREATININE: 1.12 mg/dL (ref 0.61–1.24)
Chloride: 105 mmol/L (ref 101–111)
GFR calc non Af Amer: >60 mL/min (ref >60)
Glucose, Bld: 90 mg/dL (ref 65–99)
Potassium: 3.6 mmol/L (ref 3.5–5.1)
SODIUM: 141 mmol/L (ref 135–145)

## 2014-11-17 LAB — CBC WITH DIFFERENTIAL/PLATELET
BASOS ABS: 0 10*3/uL (ref 0.0–0.1)
BASOS PCT: 0 % (ref 0–1)
EOS ABS: 0.3 10*3/uL (ref 0.0–0.7)
Eosinophils Relative: 5 % (ref 0–5)
HCT: 28.8 % — ABNORMAL LOW (ref 39.0–52.0)
HEMOGLOBIN: 9.9 g/dL — AB (ref 13.0–17.0)
Lymphocytes Relative: 35 % (ref 12–46)
Lymphs Abs: 1.8 10*3/uL (ref 0.7–4.0)
MCH: 36.3 pg — ABNORMAL HIGH (ref 26.0–34.0)
MCHC: 34.4 g/dL (ref 30.0–36.0)
MCV: 105.5 fL — ABNORMAL HIGH (ref 78.0–100.0)
Monocytes Absolute: 0.3 10*3/uL (ref 0.1–1.0)
Monocytes Relative: 7 % (ref 3–12)
NEUTROS PCT: 53 % (ref 43–77)
Neutro Abs: 2.7 10*3/uL (ref 1.7–7.7)
Platelets: 237 10*3/uL (ref 150–400)
RBC: 2.73 MIL/uL — AB (ref 4.22–5.81)
RDW: 12.9 % (ref 11.5–15.5)
WBC: 5.1 10*3/uL (ref 4.0–10.5)

## 2014-11-17 LAB — D-DIMER, QUANTITATIVE: D-Dimer, Quant: 0.28 ug/mL-FEU (ref 0.00–0.48)

## 2014-11-17 NOTE — ED Provider Notes (Signed)
CSN: [id number]     Arrival date & time 11/17/14  1955 History  This chart was scribed for Vanetta Mulders, MD by Doreatha Martin, ED Scribe. This patient was seen in room APA10/APA10 and the patient's care was started at 8:40 PM.     Chief Complaint  Patient presents with  . Shortness of Breath   Patient is a 34 y.o. male presenting with shortness of breath. The history is provided by the patient. No language interpreter was used.  Shortness of Breath Severity:  Moderate Duration:  1 hour Chronicity:  New Context comment:  Recent surgery  Associated symptoms: chest pain and headaches   Associated symptoms: no abdominal pain, no cough, no fever, no rash, no sore throat and no vomiting   HPI Comments: Bryan Robles is a 34 y.o. male s/p vein ligation in the right arm (11/13/14) who presents to the Emergency Department complaining of moderate SOB onset 1 hr ago with associated tingling in the BUE and BLE, CP describes as chest tightness, HA localized to the back of the head, back pain. His surgery was done at [facility] by Dr. Foy Guadal[place]ara, and he was given return precautions. Pt states he did not receive a graft during his procedure. PCP is Dr. Ouida Sills. He denies fever, chills, rhinorrhea, cough, sore throat, visual disturbance, leg swelling, abdominal pain, diarrhea, nausea, vomiting, rash, bruising easily, dysuria, hematuria.   History reviewed. No pertinent past medical history. Past Surgical History  Procedure Laterality Date  . Back surgery    . Wound exploration Right 11/13/2014    Procedure: WOUND EXPLORATION of Right Arm and Ligation of superficial vein.;  Surgeon: Nada Libman, MD;  Location: [facility] OR;  Service: Vascular;  Laterality: Right;   History reviewed. No pertinent family history. Social History  Substance Use Topics  . Smoking status: Current Every Day Smoker -- 1.00 packs/day  . Smokeless tobacco: None  . Alcohol Use: No    Review of Systems  Constitutional: Negative for fever and  chills.  HENT: Negative for rhinorrhea and sore throat.   Eyes: Negative for visual disturbance.  Respiratory: Positive for chest tightness and shortness of breath. Negative for cough.   Cardiovascular: Positive for chest pain. Negative for leg swelling.  Gastrointestinal: Negative for nausea, vomiting, abdominal pain and diarrhea.  Genitourinary: Negative for dysuria and hematuria.  Musculoskeletal: Positive for back pain.  Skin: Negative for rash.  Neurological: Positive for headaches.       Tingling in the BUE and BLE  Hematological: Does not bruise/bleed easily.  Psychiatric/Behavioral: Negative for confusion.   Allergies  Review of patient's allergies indicates no known allergies.  Home Medications   Prior to Admission medications   Medication Sig Start Date End Date Taking? Authorizing Provider  cephALEXin (KEFLEX) 500 MG capsule Take 1 capsule (500 mg total) by mouth 4 (four) times daily. 11/14/14  Yes Lars Mage, PA-C  oxyCODONE 10 MG TABS Take 1 tablet (10 mg total) by mouth every 4 (four) hours as needed (pain). 11/14/14  Yes Lars Mage, PA-C  cyclobenzaprine (FLEXERIL) 10 MG tablet  11/09/14   Historical Provider, MD   BP 121/73 mmHg  Pulse 71  Temp(Src) 97.5 F (36.4 C) (Oral)  Resp 11  Ht 6\' 1"  (1.854 m)  Wt 188 lb (85.276 kg)  BMI 24.81 kg/m2  SpO2 99% Physical Exam  Constitutional: He is oriented to person, place, and time. He appears well-developed and well-nourished.  HENT:  Head: Normocephalic and atraumatic.  Mouth/Throat: Oropharynx  is clear and moist.  Eyes: Conjunctivae and EOM are normal. Pupils are equal, round, and reactive to light. No scleral icterus.  Sclera clear. Eyes track normal.  Neck: Normal range of motion. Neck supple.  Cardiovascular: Normal rate, regular rhythm and normal heart sounds.   No murmur heard. Pulses:      Radial pulses are 2+ on the right side, and 2+ on the left side.  Pulmonary/Chest: Effort normal and breath sounds  normal. No respiratory distress.  Lungs CTA bilaterally. O2 on RA is 98%.   Abdominal: Soft. Bowel sounds are normal. He exhibits no distension. There is no tenderness.  Musculoskeletal: Normal range of motion. He exhibits no edema.  No swelling in the ankles.  Neurological: He is alert and oriented to person, place, and time. No cranial nerve deficit. He exhibits normal muscle tone. Coordination normal.  Skin: Skin is warm and dry.  Psychiatric: He has a normal mood and affect. His behavior is normal.  Nursing note and vitals reviewed.   ED Course  Procedures (including critical care time) DIAGNOSTIC STUDIES: Oxygen Saturation is 99% on [place] 2L/min, normal by my interpretation.    COORDINATION OF CARE: 8:46 PM Discussed treatment plan with pt at bedside and pt agreed to plan.   Labs Review Labs Reviewed  BASIC METABOLIC PANEL - Abnormal; Notable for the following:    Calcium 8.7 (*)    All other components within normal limits  CBC WITH DIFFERENTIAL/PLATELET - Abnormal; Notable for the following:    RBC 2.73 (*)    Hemoglobin 9.9 (*)    HCT 28.8 (*)    MCV 105.5 (*)    MCH 36.3 (*)    All other components within normal limits  D-DIMER, QUANTITATIVE (NOT AT [facility])   Results for orders placed or performed during the hospital encounter of 11/17/14  D-dimer, quantitative (not at [facility])  Result Value Ref Range   D-Dimer, Quant 0.28 0.00 - 0.48 ug/mL-FEU  Basic metabolic panel  Result Value Ref Range   Sodium 141 135 - 145 mmol/L   Potassium 3.6 3.5 - 5.1 mmol/L   Chloride 105 101 - 111 mmol/L   CO2 28 22 - 32 mmol/L   Glucose, Bld 90 65 - 99 mg/dL   BUN 14 6 - 20 mg/dL   Creatinine, Ser 1.61 0.61 - 1.24 mg/dL   Calcium 8.7 (L) 8.9 - 10.3 mg/dL   GFR calc non Af Amer >60 >60 mL/min   GFR calc Af Amer >60 >60 mL/min   Anion gap 8 5 - 15  CBC with Differential/Platelet  Result Value Ref Range   WBC 5.1 4.0 - 10.5 K/uL   RBC 2.73 (L) 4.22 - 5.81 MIL/uL   Hemoglobin 9.9 (L)  13.0 - 17.0 g/dL   HCT 09.6 (L) 04.5 - 40.9 %   MCV 105.5 (H) 78.0 - 100.0 fL   MCH 36.3 (H) 26.0 - 34.0 pg   MCHC 34.4 30.0 - 36.0 g/dL   RDW 81.1 91.4 - 78.2 %   Platelets 237 150 - 400 K/uL   Neutrophils Relative % 53 43 - 77 %   Neutro Abs 2.7 1.7 - 7.7 K/uL   Lymphocytes Relative 35 12 - 46 %   Lymphs Abs 1.8 0.7 - 4.0 K/uL   Monocytes Relative 7 3 - 12 %   Monocytes Absolute 0.3 0.1 - 1.0 K/uL   Eosinophils Relative 5 0 - 5 %   Eosinophils Absolute 0.3 0.0 - 0.7 K/uL  Basophils Relative 0 0 - 1 %   Basophils Absolute 0.0 0.0 - 0.1 K/uL    Imaging Review Dg Chest 2 View  11/17/2014   CLINICAL DATA:  Dizziness and shortness of breath for about 30 minutes. Patient had arterial repair surgically on 11/13/2014.  EXAM: CHEST  2 VIEW  COMPARISON:  02/26/2006  FINDINGS: The heart size and mediastinal contours are within normal limits. Both lungs are clear. The visualized skeletal structures are unremarkable.  IMPRESSION: No active cardiopulmonary disease.   Electronically Signed   By: Burman Nieves M.D.   On: 11/17/2014 22:35   I have personally reviewed and evaluated these images and lab results as part of my medical decision-making.   EKG Interpretation   Date/Time:  Saturday November 17 2014 20:14:44 EDT Ventricular Rate:  95 PR Interval:  146 QRS Duration: 67 QT Interval:  339 QTC Calculation: 426 R Axis:   74 Text Interpretation:  Sinus rhythm Consider left atrial enlargement No  previous ECGs available Confirmed by Chelsi Warr  MD, Maizy Davanzo ([id number]) on  11/17/2014 8:26:26 PM      MDM   Final diagnoses:  SOB (shortness of breath)    Patient was some evidence of anemia. This is probably not acute. No explanation for the acute shortness of breath. Oxygen saturation here of been in the upper 90s. Chest x-rays negative for pneumonia pneumothorax or pulmonary edema. D-dimer is negative no evidence of pulmonary embolus based on the oxygen saturation and the d-dimer being  negative. Patient will be discharged home to follow with primary care doctor. Patient has no evidence of any acute blood loss.    I personally performed the services described in this documentation, which was scribed in my presence. The recorded information has been reviewed and is accurate.    Vanetta Mulders, MD 11/17/14 2259

## 2014-11-17 NOTE — ED Notes (Signed)
PT states understanding of care given and follow up instructions.  Informed pt to eat iron rich foods and to take a daily multivitamin to help recover from anemia due to previous blood loss.

## 2014-11-17 NOTE — ED Notes (Signed)
Pt had artery surgically repaired 11/13/14, having dizziness and shortness of breath for about 30 minutes. Told to come back to hospital if any of this occurs after surgery.

## 2014-11-17 NOTE — Discharge Instructions (Signed)
Return for any new or worse symptoms. Today's workup without any significant abnormalities. No evidence of blood clot in the lungs no evidence of pneumonia or collapsed lung.

## 2014-11-21 ENCOUNTER — Telehealth: Payer: Self-pay

## 2014-11-21 DIAGNOSIS — G8918 Other acute postprocedural pain: Secondary | ICD-10-CM

## 2014-11-21 MED ORDER — OXYCODONE HCL 10 MG PO TABS: 10.0000 mg | ORAL_TABLET | ORAL | Status: DC | PRN

## 2014-11-21 NOTE — Telephone Encounter (Signed)
Phone call from pt.  Requested refill of pain medication.  Stated the right arm incision is intact.  Reported some clear drainage.  Denied fever/ chills.  Reported numbness in the right arm that goes down to the thumb.  Rated pain at 8/10.  Follow-up appt. Is 11/28/14.  Discussed with Lianne Cure, PA.  Gave verbal okay to refill Oxycodone 10 mg, 1 tab q 6 hrs/ prn/ qty # 30; no refills.  Pt. notified that Rx will be avail. @ office for picking up.   Stated he will have his wife pick up the Rx.

## 2014-11-26 ENCOUNTER — Encounter: Payer: Self-pay | Admitting: Surgery

## 2014-11-28 ENCOUNTER — Ambulatory Visit (INDEPENDENT_AMBULATORY_CARE_PROVIDER_SITE_OTHER): Payer: Self-pay | Admitting: Surgery

## 2014-11-28 ENCOUNTER — Encounter: Payer: Self-pay | Admitting: Surgery

## 2014-11-28 ENCOUNTER — Telehealth: Payer: Self-pay | Admitting: Surgery

## 2014-11-28 VITALS — BP 137/81 | HR 97 | Temp 98.0°F | Resp 16 | Ht 73.0 in | Wt 190.0 lb

## 2014-11-28 DIAGNOSIS — G8918 Other acute postprocedural pain: Secondary | ICD-10-CM

## 2014-11-28 NOTE — Telephone Encounter (Signed)
Spoke with pt. P.T.  Bryan Robles [phone] scheduled for 12/03/14 1pm. Pt verbalized understanding. Pt has been there before.

## 2014-11-28 NOTE — Progress Notes (Signed)
Patient name: Bryan Robles MRN: [id number] DOB: 03-30-1980 Sex: male     Chief Complaint  Patient presents with  . Re-evaluation    f/u    HISTORY OF PRESENT ILLNESS: The patient is back for follow-up.  He presented with significant bleeding from his right arm after injury.  It was suspected that his brachial artery was injured but when I explored him in the operating room it was just a median cubital vein.  The artery and nerve were fully protected.  He was discharged home in back for follow-up today.  He still complains of pain in his arm and he is having trouble straightening it.  No past medical history on file.  Past Surgical History  Procedure Laterality Date  . Back surgery    . Wound exploration Right 11/13/2014    Procedure: WOUND EXPLORATION of Right Arm and Ligation of superficial vein.;  Surgeon: Nada Libman, MD;  Location: [facility] OR;  Service: Vascular;  Laterality: Right;    Social History   Social History  . Marital Status: Married    Spouse Name: N/A  . Number of Children: N/A  . Years of Education: N/A   Occupational History  . Not on file.   Social History Main Topics  . Smoking status: Current Every Day Smoker -- 1.00 packs/day  . Smokeless tobacco: Former Neurosurgeon    Quit date: 07/27/2013  . Alcohol Use: No  . Drug Use: No  . Sexual Activity: Not on file   Other Topics Concern  . Not on file   Social History Narrative    No family history on file.  Allergies as of 11/28/2014  . (No Known Allergies)    Current Outpatient Prescriptions on File Prior to Visit  Medication Sig Dispense Refill  . cyclobenzaprine (FLEXERIL) 10 MG tablet     . Oxycodone HCl 10 MG TABS Take 1 tablet (10 mg total) by mouth every 4 (four) hours as needed (pain). 30 tablet 0  . cephALEXin (KEFLEX) 500 MG capsule Take 1 capsule (500 mg total) by mouth 4 (four) times daily. (Patient not taking: Reported on 11/28/2014) 28 capsule 0   No current facility-administered  medications on file prior to visit.     REVIEW OF SYSTEMS: See history of present illness  PHYSICAL EXAMINATION:   Vital signs are  Filed Vitals:   11/28/14 1051  BP: 137/81  Pulse: 97  Temp: 98 F (36.7 C)  Resp: 16  Height:  (1.854 m)  Weight: 190 lb (86.183 kg)  SpO2: 98%   Body mass index is 25.07 kg/(m^2). General: The patient appears their stated age. HEENT:  No gross abnormalities Pulmonary:  Non labored breathing Abdomen: Soft and non-tender Musculoskeletal: He cannot straighten his arm out all the way because of pain Neurologic: No focal weakness or paresthesias are detected, Skin: There are no ulcer or rashes noted. Psychiatric: The patient has normal affect. Cardiovascular: Palpable left radial pulse incision healing  Diagnostic Studies None  Assessment: Status post trauma to left arm Plan: No arterial injury was noted at the time of his surgery and he continues to have a palpable radial pulse.  The median nerve was not exposed from the injury.  He has no neurologic deficits.  His biggest concern is pain when he tries to straighten his arm and therefore he has not straightened his arm and I feel he is developing a little contracture.  I'm giving him 40 oxycodone 5 mg tablets to  help with the pain.  I have encouraged him to do exercises to get his arm to straighten all the way and I'm also referring him to physical therapy.  He'll follow up with me again in 4 weeks  V. Charlena Cross, M.D. [facility] [place] Office: [phone] Pager:  [phone]

## 2014-12-12 ENCOUNTER — Other Ambulatory Visit: Payer: Self-pay

## 2014-12-12 DIAGNOSIS — G8918 Other acute postprocedural pain: Secondary | ICD-10-CM

## 2014-12-12 MED ORDER — OXYCODONE HCL 5 MG PO TABS: 5.0000 mg | ORAL_TABLET | Freq: Four times a day (QID) | ORAL | Status: DC | PRN

## 2014-12-12 NOTE — Progress Notes (Signed)
Pt. walked into office and reported he spoke with a nurse regarding pain medication, and was told him to come into office for a refill; no nurse, at [facility] office, spoke with pt. today, on the phone.  Rec'd Oxycodone 5 mg , #40 on 11/28/14 @ last office visit. Discussed with Dr. Edilia Bo.  Recommended to refill Oxycodone 5 mg., #20; no refills at this time.  Pt. advised that due to being one month post-op, he will not be given further narcotic pain medication refills.

## 2014-12-28 ENCOUNTER — Encounter: Payer: Self-pay | Admitting: Surgery

## 2014-12-31 ENCOUNTER — Ambulatory Visit: Payer: 59 | Admitting: Surgery

## 2014-12-31 ENCOUNTER — Ambulatory Visit (INDEPENDENT_AMBULATORY_CARE_PROVIDER_SITE_OTHER): Payer: 59 | Admitting: Surgery

## 2014-12-31 ENCOUNTER — Encounter: Payer: Self-pay | Admitting: Surgery

## 2014-12-31 VITALS — BP 141/98 | HR 98 | Ht 73.0 in | Wt 195.0 lb

## 2014-12-31 DIAGNOSIS — G8918 Other acute postprocedural pain: Secondary | ICD-10-CM

## 2014-12-31 NOTE — Progress Notes (Signed)
     Patient name: Bryan BrucknerBrad Umholtz MRN: [id number] DOB: 1980-08-06 Sex: male     Chief Complaint  Patient presents with  . Re-evaluation    4 wk f/u     HISTORY OF PRESENT ILLNESS: The patient is back for follow-up.  On 11/14/2014 he presented to the hospital with a laceration injury to the right arm which was thought to be a brachial artery injury given the amount of bleeding.  In the operating room there was transection of a superficial vein.  This was ligated.  The brachial artery and median nerve were fully protected and intact.  When I saw him several weeks ago he was having issues with a contracture in his right arm.  I sent him for physical therapy.  He has had significant improvement.  No past medical history on file.  Past Surgical History  Procedure Laterality Date  . Back surgery    . Wound exploration Right 11/13/2014    Procedure: WOUND EXPLORATION of Right Arm and Ligation of superficial vein.;  Surgeon: Nada LibmanVance W Adina Puzzo, MD;  [facility];  Service: Vascular;  Laterality: Right;    Social History   Social History  . Marital Status: Married    Spouse Name: N/A  . Number of Children: N/A  . Years of Education: N/A   Occupational History  . Not on file.   Social History Main Topics  . Smoking status: Current Every Day Smoker -- 1.00 packs/day    Types: Cigarettes  . Smokeless tobacco: Former NeurosurgeonUser    Quit date: 07/27/2013  . Alcohol Use: No  . Drug Use: No  . Sexual Activity: Not on file   Other Topics Concern  . Not on file   Social History Narrative    No family history on file.  Allergies as of 12/31/2014  . (No Known Allergies)    Current Outpatient Prescriptions on File Prior to Visit  Medication Sig Dispense Refill  . cyclobenzaprine (FLEXERIL) 10 MG tablet     . losartan (COZAAR) 100 MG tablet     . etodolac (LODINE) 400 MG tablet     . oxyCODONE (OXY IR/ROXICODONE) 5 MG immediate release tablet Take 1 tablet (5 mg total) by mouth every 6 (six)  hours as needed for severe pain. (Patient not taking: Reported on 12/31/2014) 20 tablet 0   No current facility-administered medications on file prior to visit.       PHYSICAL EXAMINATION:   Vital signs are  Filed Vitals:   12/31/14 0915 12/31/14 0918  BP: 143/91 141/98  Pulse: 98   Height: 6\' 1"  (1.854 m)   Weight: 195 lb (88.451 kg)   SpO2: 98%    Body mass index is 25.73 kg/(m^2). General: The patient appears their stated age. Full range of motion in right arm Palpable right radial pulse Incision well healed   Diagnostic Studies None  Assessment: Laceration to right arm Plan: Patient is doing very well.  His wounds are healed.  He can now straighten his right arm.  He does have a irregular appearance of his bicep muscle, possibly concerning for a tendon retraction.  I have offered him evaluation with orthopedic surgery, however he does not wish to pursue this at this time as he does not have any functional limitations.  He'll follow with me on an as-needed basis.  Jorge NyV. Wells Quinto Tippy IV, M.D. [facility] [place] Office: [phone] Pager:  [phone]

## 2015-01-04 DIAGNOSIS — Z0279 Encounter for issue of other medical certificate: Secondary | ICD-10-CM

## 2015-07-01 ENCOUNTER — Other Ambulatory Visit (HOSPITAL_COMMUNITY): Payer: Self-pay | Admitting: Internal Medicine

## 2015-07-01 DIAGNOSIS — R51 Headache: Principal | ICD-10-CM

## 2015-07-01 DIAGNOSIS — G8929 Other chronic pain: Secondary | ICD-10-CM

## 2015-07-04 ENCOUNTER — Ambulatory Visit (HOSPITAL_COMMUNITY)
Admission: RE | Admit: 2015-07-04 | Discharge: 2015-07-04 | Disposition: A | Discharge status: Home / Self Care | Payer: PRIVATE HEALTH INSURANCE | Source: Ambulatory Visit | Attending: Internal Medicine | Admitting: Internal Medicine

## 2015-07-04 ENCOUNTER — Other Ambulatory Visit (HOSPITAL_COMMUNITY): Payer: Self-pay | Admitting: Internal Medicine

## 2015-07-04 DIAGNOSIS — R51 Headache: Secondary | ICD-10-CM | POA: Insufficient documentation

## 2015-07-04 DIAGNOSIS — G8929 Other chronic pain: Secondary | ICD-10-CM

## 2015-07-04 DIAGNOSIS — J349 Unspecified disorder of nose and nasal sinuses: Secondary | ICD-10-CM | POA: Diagnosis not present

## 2015-07-30 ENCOUNTER — Other Ambulatory Visit (HOSPITAL_COMMUNITY): Payer: Self-pay | Admitting: Neurosurgery

## 2015-07-30 DIAGNOSIS — M545 Low back pain: Principal | ICD-10-CM

## 2015-07-30 DIAGNOSIS — G8929 Other chronic pain: Secondary | ICD-10-CM

## 2015-08-09 ENCOUNTER — Ambulatory Visit (HOSPITAL_COMMUNITY)
Admission: RE | Admit: 2015-08-09 | Discharge: 2015-08-09 | Disposition: A | Discharge status: Home / Self Care | Payer: PRIVATE HEALTH INSURANCE | Source: Ambulatory Visit | Attending: Neurosurgery | Admitting: Neurosurgery

## 2015-08-09 DIAGNOSIS — M545 Low back pain, unspecified: Secondary | ICD-10-CM

## 2015-08-09 DIAGNOSIS — G8929 Other chronic pain: Secondary | ICD-10-CM

## 2015-08-09 DIAGNOSIS — M5127 Other intervertebral disc displacement, lumbosacral region: Secondary | ICD-10-CM | POA: Insufficient documentation

## 2015-08-09 MED ORDER — GADOBENATE DIMEGLUMINE 529 MG/ML IV SOLN
20.0000 mL | Freq: Once | INTRAVENOUS | Status: AC | PRN
  Administered 2015-08-09: 18 mL via INTRAVENOUS

## 2015-12-30 ENCOUNTER — Other Ambulatory Visit: Payer: Self-pay | Admitting: Neurosurgery

## 2016-01-15 ENCOUNTER — Encounter (HOSPITAL_COMMUNITY)
Admission: RE | Admit: 2016-01-15 | Discharge: 2016-01-15 | Disposition: A | Discharge status: Home / Self Care | Payer: No Typology Code available for payment source | Source: Ambulatory Visit | Attending: Neurosurgery | Admitting: Neurosurgery

## 2016-01-15 ENCOUNTER — Encounter (HOSPITAL_COMMUNITY): Payer: Self-pay

## 2016-01-15 DIAGNOSIS — Z01812 Encounter for preprocedural laboratory examination: Secondary | ICD-10-CM | POA: Diagnosis present

## 2016-01-15 DIAGNOSIS — Z01810 Encounter for preprocedural cardiovascular examination: Secondary | ICD-10-CM | POA: Insufficient documentation

## 2016-01-15 HISTORY — DX: Headache: R51

## 2016-01-15 HISTORY — DX: Essential (primary) hypertension: I10

## 2016-01-15 HISTORY — DX: Headache, unspecified: R51.9

## 2016-01-15 HISTORY — DX: Unspecified osteoarthritis, unspecified site: M19.90

## 2016-01-15 LAB — BASIC METABOLIC PANEL
ANION GAP: 5 (ref 5–15)
BUN: 11 mg/dL (ref 6–20)
CALCIUM: 9.7 mg/dL (ref 8.9–10.3)
CO2: 28 mmol/L (ref 22–32)
CREATININE: 1.18 mg/dL (ref 0.61–1.24)
Chloride: 107 mmol/L (ref 101–111)
Glucose, Bld: 77 mg/dL (ref 65–99)
Potassium: 4.3 mmol/L (ref 3.5–5.1)
Sodium: 140 mmol/L (ref 135–145)

## 2016-01-15 LAB — TYPE AND SCREEN
ABO/RH(D): A POS
ANTIBODY SCREEN: NEGATIVE

## 2016-01-15 LAB — CBC
HCT: 42.5 % (ref 39.0–52.0)
HEMOGLOBIN: 14.6 g/dL (ref 13.0–17.0)
MCH: 35.3 pg — AB (ref 26.0–34.0)
MCHC: 34.4 g/dL (ref 30.0–36.0)
MCV: 102.7 fL — ABNORMAL HIGH (ref 78.0–100.0)
PLATELETS: 240 10*3/uL (ref 150–400)
RBC: 4.14 MIL/uL — AB (ref 4.22–5.81)
RDW: 12.4 % (ref 11.5–15.5)
WBC: 6.5 10*3/uL (ref 4.0–10.5)

## 2016-01-15 LAB — SURGICAL PCR SCREEN
MRSA, PCR: NEGATIVE
STAPHYLOCOCCUS AUREUS: POSITIVE — AB

## 2016-01-15 NOTE — Pre-Procedure Instructions (Addendum)
Octavia BrucknerBrad Langford  01/15/2016      [facility] - [place], [place] - [address] [place] [id number] Phone: [phone] Fax: [phone]    Your procedure is scheduled on 01/23/16.  [facility] Ad[facility] at 715 A.M.  Call this number if you have problems the morning of surgery:  [phone]   Remember:  Do not eat food or drink liquids after midnight.  Take these medicines the morning of surgery with A SIP OF WATER propanolol, verapamil,hydrocodone  STOP all herbel meds, nsaids (aleve,naproxen,advil,ibuprofen)  Starting TODAY including etodolac, vitamins, aspirin   Do not wear jewelry, make-up or nail polish.  Do not wear lotions, powders, or perfumes, or deoderant.  Do not shave 48 hours prior to surgery.  Men may shave face and neck.  Do not bring valuables to the hospital.  [facility] is not responsible for any belongings or valuables.  Contacts, dentures or bridgework may not be worn into surgery.  Leave your suitcase in the car.  After surgery it may be brought to your room.  For patients admitted to the hospital, discharge time will be determined by your treatment team.  Patients discharged the day of surgery will not be allowed to drive home.   :    Special instructions:   Special Instructions: [place] - Preparing for Surgery  Before surgery, you can play an important role.  Because skin is not sterile, your skin needs to be as free of germs as possible.  You can reduce the number of germs on you skin by washing with CHG (chlorahexidine gluconate) soap before surgery.  CHG is an antiseptic cleaner which kills germs and bonds with the skin to continue killing germs even after washing.  Please DO NOT use if you have an allergy to CHG or antibacterial soaps.  If your skin becomes reddened/irritated stop using the CHG and inform your nurse when you arrive at Short Stay.  Do not shave (including legs and  underarms) for at least 48 hours prior to the first CHG shower.  You may shave your face.  Please follow these instructions carefully:   1.  Shower with CHG Soap the night before surgery and the morning of Surgery.  2.  If you choose to wash your hair, wash your hair first as usual with your normal shampoo.  3.  After you shampoo, rinse your hair and body thoroughly to remove the Shampoo.  4.  Use CHG as you would any other liquid soap.  You can apply chg directly  to the skin and wash gently with scrungie or a clean washcloth.  5.  Apply the CHG Soap to your body ONLY FROM THE NECK DOWN.  Do not use on open wounds or open sores.  Avoid contact with your eyes ears, mouth and genitals (private parts).  Wash genitals (private parts)       with your normal soap.  6.  Wash thoroughly, paying special attention to the area where your surgery will be performed.  7.  Thoroughly rinse your body with warm water from the neck down.  8.  DO NOT shower/wash with your normal soap after using and rinsing off the CHG Soap.  9.  Pat yourself dry with a clean towel.            10.  Wear clean pajamas.            11.  Place clean  sheets on your bed the night of your first shower and do not sleep with pets.  Day of Surgery  Do not apply any lotions/deodorants the morning of surgery.  Please wear clean clothes to the hospital/surgery center.  Please read over the  fact sheets that you were given.

## 2016-01-23 ENCOUNTER — Encounter (HOSPITAL_COMMUNITY): Payer: Self-pay | Admitting: Urology

## 2016-01-23 ENCOUNTER — Inpatient Hospital Stay (HOSPITAL_COMMUNITY): Payer: No Typology Code available for payment source | Admitting: Certified Registered"

## 2016-01-23 ENCOUNTER — Encounter (HOSPITAL_COMMUNITY)
Admission: RE | Disposition: A | Discharge status: Home / Self Care | Payer: Self-pay | Source: Ambulatory Visit | Attending: Neurosurgery

## 2016-01-23 ENCOUNTER — Inpatient Hospital Stay (HOSPITAL_COMMUNITY)
Admission: RE | Admit: 2016-01-23 | Discharge: 2016-01-27 | DRG: 455 | Disposition: A | Discharge status: Home / Self Care | Payer: No Typology Code available for payment source | Source: Ambulatory Visit | Attending: Neurosurgery | Admitting: Neurosurgery

## 2016-01-23 ENCOUNTER — Inpatient Hospital Stay (HOSPITAL_COMMUNITY): Payer: No Typology Code available for payment source

## 2016-01-23 DIAGNOSIS — M48061 Spinal stenosis, lumbar region without neurogenic claudication: Secondary | ICD-10-CM | POA: Diagnosis present

## 2016-01-23 DIAGNOSIS — M549 Dorsalgia, unspecified: Secondary | ICD-10-CM | POA: Diagnosis present

## 2016-01-23 DIAGNOSIS — I10 Essential (primary) hypertension: Secondary | ICD-10-CM | POA: Diagnosis present

## 2016-01-23 DIAGNOSIS — Z79899 Other long term (current) drug therapy: Secondary | ICD-10-CM | POA: Diagnosis not present

## 2016-01-23 DIAGNOSIS — F17210 Nicotine dependence, cigarettes, uncomplicated: Secondary | ICD-10-CM | POA: Diagnosis present

## 2016-01-23 DIAGNOSIS — M4807 Spinal stenosis, lumbosacral region: Secondary | ICD-10-CM | POA: Diagnosis present

## 2016-01-23 DIAGNOSIS — M5126 Other intervertebral disc displacement, lumbar region: Secondary | ICD-10-CM | POA: Diagnosis present

## 2016-01-23 DIAGNOSIS — M5117 Intervertebral disc disorders with radiculopathy, lumbosacral region: Secondary | ICD-10-CM | POA: Diagnosis present

## 2016-01-23 DIAGNOSIS — M5116 Intervertebral disc disorders with radiculopathy, lumbar region: Secondary | ICD-10-CM | POA: Diagnosis present

## 2016-01-23 DIAGNOSIS — Z419 Encounter for procedure for purposes other than remedying health state, unspecified: Secondary | ICD-10-CM

## 2016-01-23 SURGERY — POSTERIOR LUMBAR FUSION 2 LEVEL
Anesthesia: General

## 2016-01-23 MED ORDER — THROMBIN 20000 UNITS EX SOLR
CUTANEOUS | Status: AC
  Filled 2016-01-23: qty 20000

## 2016-01-23 MED ORDER — ALUM & MAG HYDROXIDE-SIMETH 200-200-20 MG/5ML PO SUSP: 30.0000 mL | Freq: Four times a day (QID) | ORAL | Status: DC | PRN

## 2016-01-23 MED ORDER — PROPOFOL 10 MG/ML IV BOLUS
INTRAVENOUS | Status: AC
  Filled 2016-01-23: qty 20

## 2016-01-23 MED ORDER — LIDOCAINE 2% (20 MG/ML) 5 ML SYRINGE
INTRAMUSCULAR | Status: AC
  Filled 2016-01-23: qty 5

## 2016-01-23 MED ORDER — ARTIFICIAL TEARS OP OINT
TOPICAL_OINTMENT | OPHTHALMIC | Status: DC | PRN
  Administered 2016-01-23: 1 via OPHTHALMIC

## 2016-01-23 MED ORDER — FENTANYL CITRATE (PF) 100 MCG/2ML IJ SOLN
INTRAMUSCULAR | Status: AC
  Filled 2016-01-23: qty 2

## 2016-01-23 MED ORDER — LIDOCAINE-EPINEPHRINE 1 %-1:100000 IJ SOLN
INTRAMUSCULAR | Status: AC
  Filled 2016-01-23: qty 1

## 2016-01-23 MED ORDER — PROMETHAZINE HCL 25 MG/ML IJ SOLN: 6.2500 mg | INTRAMUSCULAR | Status: DC | PRN

## 2016-01-23 MED ORDER — THROMBIN 5000 UNITS EX SOLR
CUTANEOUS | Status: AC
  Filled 2016-01-23: qty 5000

## 2016-01-23 MED ORDER — ROCURONIUM BROMIDE 100 MG/10ML IV SOLN
INTRAVENOUS | Status: DC | PRN
  Administered 2016-01-23 (×2): 20 mg via INTRAVENOUS
  Administered 2016-01-23: 60 mg via INTRAVENOUS
  Administered 2016-01-23: 20 mg via INTRAVENOUS

## 2016-01-23 MED ORDER — BACITRACIN ZINC 500 UNIT/GM EX OINT
TOPICAL_OINTMENT | CUTANEOUS | Status: AC
  Filled 2016-01-23: qty 28.35

## 2016-01-23 MED ORDER — VANCOMYCIN HCL 1000 MG IV SOLR
INTRAVENOUS | Status: DC | PRN
  Administered 2016-01-23: 1000 mg

## 2016-01-23 MED ORDER — HYDROMORPHONE HCL 1 MG/ML IJ SOLN
INTRAMUSCULAR | Status: AC
  Filled 2016-01-23: qty 0.5

## 2016-01-23 MED ORDER — DOCUSATE SODIUM 100 MG PO CAPS
100.0000 mg | ORAL_CAPSULE | Freq: Two times a day (BID) | ORAL | Status: DC
  Administered 2016-01-23 – 2016-01-27 (×8): 100 mg via ORAL
  Filled 2016-01-23 (×8): qty 1

## 2016-01-23 MED ORDER — SURGIFOAM 100 EX MISC
CUTANEOUS | Status: DC | PRN
  Administered 2016-01-23: 11:00:00 via TOPICAL

## 2016-01-23 MED ORDER — DEXAMETHASONE SODIUM PHOSPHATE 10 MG/ML IJ SOLN
INTRAMUSCULAR | Status: AC
  Filled 2016-01-23: qty 1

## 2016-01-23 MED ORDER — ROCURONIUM BROMIDE 10 MG/ML (PF) SYRINGE
PREFILLED_SYRINGE | INTRAVENOUS | Status: AC
  Filled 2016-01-23: qty 10

## 2016-01-23 MED ORDER — PROPRANOLOL HCL ER 80 MG PO CP24
80.0000 mg | ORAL_CAPSULE | Freq: Every day | ORAL | Status: DC
  Administered 2016-01-24 – 2016-01-26 (×2): 80 mg via ORAL
  Filled 2016-01-23 (×4): qty 1

## 2016-01-23 MED ORDER — KETOROLAC TROMETHAMINE 0.5 % OP SOLN
1.0000 [drp] | Freq: Three times a day (TID) | OPHTHALMIC | Status: AC | PRN
  Administered 2016-01-23: 1 [drp] via OPHTHALMIC
  Filled 2016-01-23: qty 3

## 2016-01-23 MED ORDER — FENTANYL CITRATE (PF) 100 MCG/2ML IJ SOLN
INTRAMUSCULAR | Status: AC
  Filled 2016-01-23: qty 4

## 2016-01-23 MED ORDER — PROPOFOL 10 MG/ML IV BOLUS
INTRAVENOUS | Status: DC | PRN
  Administered 2016-01-23: 200 mg via INTRAVENOUS

## 2016-01-23 MED ORDER — SUGAMMADEX SODIUM 200 MG/2ML IV SOLN
INTRAVENOUS | Status: AC
  Filled 2016-01-23: qty 2

## 2016-01-23 MED ORDER — VERAPAMIL HCL ER 240 MG PO TBCR
240.0000 mg | EXTENDED_RELEASE_TABLET | Freq: Every day | ORAL | Status: DC
  Administered 2016-01-24 – 2016-01-27 (×3): 240 mg via ORAL
  Filled 2016-01-23 (×4): qty 1

## 2016-01-23 MED ORDER — SODIUM CHLORIDE 0.9 % IR SOLN
Status: DC | PRN
  Administered 2016-01-23: 11:00:00

## 2016-01-23 MED ORDER — CEFAZOLIN SODIUM-DEXTROSE 2-4 GM/100ML-% IV SOLN
2.0000 g | INTRAVENOUS | Status: AC
  Administered 2016-01-23 (×2): 2 g via INTRAVENOUS
  Filled 2016-01-23: qty 100

## 2016-01-23 MED ORDER — MIDAZOLAM HCL 2 MG/2ML IJ SOLN
INTRAMUSCULAR | Status: AC
  Filled 2016-01-23: qty 2

## 2016-01-23 MED ORDER — PHENYLEPHRINE HCL 10 MG/ML IJ SOLN
INTRAVENOUS | Status: DC | PRN
  Administered 2016-01-23: 20 ug/min via INTRAVENOUS

## 2016-01-23 MED ORDER — DIAZEPAM 5 MG PO TABS
5.0000 mg | ORAL_TABLET | Freq: Four times a day (QID) | ORAL | Status: DC | PRN
  Administered 2016-01-24 – 2016-01-26 (×6): 5 mg via ORAL
  Filled 2016-01-23 (×6): qty 1

## 2016-01-23 MED ORDER — ACETAMINOPHEN 650 MG RE SUPP: 650.0000 mg | RECTAL | Status: DC | PRN

## 2016-01-23 MED ORDER — SUCCINYLCHOLINE CHLORIDE 200 MG/10ML IV SOSY
PREFILLED_SYRINGE | INTRAVENOUS | Status: AC
  Filled 2016-01-23: qty 30

## 2016-01-23 MED ORDER — CHLORHEXIDINE GLUCONATE CLOTH 2 % EX PADS: 6.0000 | MEDICATED_PAD | Freq: Once | CUTANEOUS | Status: DC

## 2016-01-23 MED ORDER — DEXAMETHASONE SODIUM PHOSPHATE 10 MG/ML IJ SOLN
INTRAMUSCULAR | Status: DC | PRN
  Administered 2016-01-23: 10 mg via INTRAVENOUS

## 2016-01-23 MED ORDER — CEFAZOLIN SODIUM-DEXTROSE 2-4 GM/100ML-% IV SOLN
2.0000 g | Freq: Three times a day (TID) | INTRAVENOUS | Status: AC
  Administered 2016-01-23 – 2016-01-24 (×2): 2 g via INTRAVENOUS
  Filled 2016-01-23 (×2): qty 100

## 2016-01-23 MED ORDER — HYDROCODONE-ACETAMINOPHEN 10-325 MG PO TABS
1.0000 | ORAL_TABLET | Freq: Three times a day (TID) | ORAL | Status: DC | PRN
  Administered 2016-01-23 – 2016-01-25 (×2): 1 via ORAL
  Filled 2016-01-23 (×2): qty 1

## 2016-01-23 MED ORDER — OXYCODONE-ACETAMINOPHEN 5-325 MG PO TABS
1.0000 | ORAL_TABLET | ORAL | Status: DC | PRN
Start: 2016-01-23 — End: 2016-01-26
  Administered 2016-01-23 – 2016-01-26 (×11): 2 via ORAL
  Filled 2016-01-23 (×11): qty 2

## 2016-01-23 MED ORDER — BACITRACIN ZINC 500 UNIT/GM EX OINT
TOPICAL_OINTMENT | CUTANEOUS | Status: DC | PRN
  Administered 2016-01-23: 1 via TOPICAL

## 2016-01-23 MED ORDER — ACETAMINOPHEN 325 MG PO TABS: 650.0000 mg | ORAL_TABLET | ORAL | Status: DC | PRN

## 2016-01-23 MED ORDER — POLYMYXIN B-TRIMETHOPRIM 10000-0.1 UNIT/ML-% OP SOLN
1.0000 [drp] | Freq: Three times a day (TID) | OPHTHALMIC | Status: AC
  Administered 2016-01-23 – 2016-01-24 (×3): 1 [drp] via OPHTHALMIC
  Filled 2016-01-23: qty 10

## 2016-01-23 MED ORDER — EPHEDRINE SULFATE 50 MG/ML IJ SOLN
INTRAMUSCULAR | Status: DC | PRN
  Administered 2016-01-23 (×2): 10 mg via INTRAVENOUS

## 2016-01-23 MED ORDER — VANCOMYCIN HCL 1000 MG IV SOLR
INTRAVENOUS | Status: AC
  Filled 2016-01-23: qty 1000

## 2016-01-23 MED ORDER — ONDANSETRON HCL 4 MG/2ML IJ SOLN
INTRAMUSCULAR | Status: AC
  Filled 2016-01-23: qty 2

## 2016-01-23 MED ORDER — PHENYLEPHRINE 40 MCG/ML (10ML) SYRINGE FOR IV PUSH (FOR BLOOD PRESSURE SUPPORT)
PREFILLED_SYRINGE | INTRAVENOUS | Status: AC
  Filled 2016-01-23: qty 20

## 2016-01-23 MED ORDER — FENTANYL CITRATE (PF) 100 MCG/2ML IJ SOLN
INTRAMUSCULAR | Status: DC | PRN
  Administered 2016-01-23 (×7): 50 ug via INTRAVENOUS
  Administered 2016-01-23: 150 ug via INTRAVENOUS

## 2016-01-23 MED ORDER — PHENOL 1.4 % MT LIQD: 1.0000 | OROMUCOSAL | Status: DC | PRN

## 2016-01-23 MED ORDER — LIDOCAINE HCL (CARDIAC) 20 MG/ML IV SOLN
INTRAVENOUS | Status: DC | PRN
  Administered 2016-01-23: 100 mg via INTRAVENOUS

## 2016-01-23 MED ORDER — LOSARTAN POTASSIUM 50 MG PO TABS
100.0000 mg | ORAL_TABLET | Freq: Every day | ORAL | Status: DC
  Administered 2016-01-24 – 2016-01-26 (×2): 100 mg via ORAL
  Filled 2016-01-23 (×4): qty 2

## 2016-01-23 MED ORDER — BUPIVACAINE LIPOSOME 1.3 % IJ SUSP
20.0000 mL | INTRAMUSCULAR | Status: AC
  Administered 2016-01-23: 20 mL
  Filled 2016-01-23: qty 20

## 2016-01-23 MED ORDER — 0.9 % SODIUM CHLORIDE (POUR BTL) OPTIME
TOPICAL | Status: DC | PRN
  Administered 2016-01-23: 1000 mL

## 2016-01-23 MED ORDER — EPHEDRINE 5 MG/ML INJ
INTRAVENOUS | Status: AC
  Filled 2016-01-23: qty 10

## 2016-01-23 MED ORDER — MEPERIDINE HCL 25 MG/ML IJ SOLN: 6.2500 mg | INTRAMUSCULAR | Status: DC | PRN

## 2016-01-23 MED ORDER — SUGAMMADEX SODIUM 200 MG/2ML IV SOLN
INTRAVENOUS | Status: DC | PRN
  Administered 2016-01-23: 175 mg via INTRAVENOUS

## 2016-01-23 MED ORDER — HYDROMORPHONE HCL 1 MG/ML IJ SOLN
0.2500 mg | INTRAMUSCULAR | Status: DC | PRN
  Administered 2016-01-23 (×3): 0.5 mg via INTRAVENOUS

## 2016-01-23 MED ORDER — LACTATED RINGERS IV SOLN
INTRAVENOUS | Status: DC
  Administered 2016-01-23: 21:00:00 via INTRAVENOUS

## 2016-01-23 MED ORDER — ONDANSETRON HCL 4 MG/2ML IJ SOLN
INTRAMUSCULAR | Status: DC | PRN
  Administered 2016-01-23 (×2): 4 mg via INTRAVENOUS

## 2016-01-23 MED ORDER — MORPHINE SULFATE (PF) 2 MG/ML IV SOLN
1.0000 mg | INTRAVENOUS | Status: DC | PRN
  Filled 2016-01-23: qty 1
  Filled 2016-01-23: qty 2

## 2016-01-23 MED ORDER — MIDAZOLAM HCL 5 MG/5ML IJ SOLN
INTRAMUSCULAR | Status: DC | PRN
  Administered 2016-01-23: 2 mg via INTRAVENOUS

## 2016-01-23 MED ORDER — LACTATED RINGERS IV SOLN
INTRAVENOUS | Status: DC
Start: 2016-01-23 — End: 2016-01-27
  Administered 2016-01-23 (×3): via INTRAVENOUS

## 2016-01-23 MED ORDER — MENTHOL 3 MG MT LOZG: 1.0000 | LOZENGE | OROMUCOSAL | Status: DC | PRN

## 2016-01-23 MED ORDER — ONDANSETRON HCL 4 MG/2ML IJ SOLN: 4.0000 mg | INTRAMUSCULAR | Status: DC | PRN

## 2016-01-23 MED ORDER — BISACODYL 10 MG RE SUPP: 10.0000 mg | Freq: Every day | RECTAL | Status: DC | PRN

## 2016-01-23 MED ORDER — PHENYLEPHRINE HCL 10 MG/ML IJ SOLN
INTRAMUSCULAR | Status: DC | PRN
  Administered 2016-01-23: 80 ug via INTRAVENOUS
  Administered 2016-01-23 (×2): 40 ug via INTRAVENOUS
  Administered 2016-01-23: 160 ug via INTRAVENOUS
  Administered 2016-01-23: 80 ug via INTRAVENOUS

## 2016-01-23 MED ORDER — LIDOCAINE-EPINEPHRINE 1 %-1:100000 IJ SOLN
INTRAMUSCULAR | Status: DC | PRN
  Administered 2016-01-23: 10 mL

## 2016-01-23 MED ORDER — PHENYLEPHRINE HCL 10 MG/ML IJ SOLN
INTRAMUSCULAR | Status: AC
  Filled 2016-01-23: qty 2

## 2016-01-23 MED ORDER — CEFAZOLIN SODIUM 1 G IJ SOLR
INTRAMUSCULAR | Status: AC
  Filled 2016-01-23: qty 20

## 2016-01-23 MED ORDER — BSS IO SOLN
15.0000 mL | Freq: Once | INTRAOCULAR | Status: AC
  Administered 2016-01-23: 15 mL
  Filled 2016-01-23: qty 15

## 2016-01-23 SURGICAL SUPPLY — 72 items
BAG DECANTER FOR FLEXI CONT (MISCELLANEOUS) ×3 IMPLANT
BASKET BONE COLLECTION (BASKET) ×3 IMPLANT
BENZOIN TINCTURE PRP APPL 2/3 (GAUZE/BANDAGES/DRESSINGS) ×3 IMPLANT
BLADE CLIPPER SURG (BLADE) IMPLANT
BUR MATCHSTICK NEURO 3.0 LAGG (BURR) ×3 IMPLANT
BUR PRECISION FLUTE 6.0 (BURR) ×3 IMPLANT
CAGE ALTERA 10X31X9-13 15D (Cage) ×2 IMPLANT
CAGE ALTERA 9-13-15-31MM (Cage) ×1 IMPLANT
CANISTER SUCT 3000ML PPV (MISCELLANEOUS) ×3 IMPLANT
CAP REVERE LOCKING (Cap) ×18 IMPLANT
CARTRIDGE OIL MAESTRO DRILL (MISCELLANEOUS) ×1 IMPLANT
CLOSURE WOUND 1/2 X4 (GAUZE/BANDAGES/DRESSINGS) ×1
CONT SPEC 4OZ CLIKSEAL STRL BL (MISCELLANEOUS) ×3 IMPLANT
CORDS BIPOLAR (ELECTRODE) ×3 IMPLANT
COVER BACK TABLE 60X90IN (DRAPES) ×3 IMPLANT
COVER TABLE BACK 60X90 (DRAPES) ×3 IMPLANT
DIFFUSER DRILL AIR PNEUMATIC (MISCELLANEOUS) ×3 IMPLANT
DRAPE C-ARM 42X72 X-RAY (DRAPES) ×6 IMPLANT
DRAPE HALF SHEET 40X57 (DRAPES) ×3 IMPLANT
DRAPE LAPAROTOMY 100X72X124 (DRAPES) ×3 IMPLANT
DRAPE POUCH INSTRU U-SHP 10X18 (DRAPES) ×3 IMPLANT
DRAPE SURG 17X23 STRL (DRAPES) ×12 IMPLANT
ELECT BLADE 4.0 EZ CLEAN MEGAD (MISCELLANEOUS) ×3
ELECT REM PT RETURN 9FT ADLT (ELECTROSURGICAL) ×3
ELECTRODE BLDE 4.0 EZ CLN MEGD (MISCELLANEOUS) ×1 IMPLANT
ELECTRODE REM PT RTRN 9FT ADLT (ELECTROSURGICAL) ×1 IMPLANT
GAUZE SPONGE 4X4 12PLY STRL (GAUZE/BANDAGES/DRESSINGS) ×3 IMPLANT
GAUZE SPONGE 4X4 16PLY XRAY LF (GAUZE/BANDAGES/DRESSINGS) ×3 IMPLANT
GLOVE BIO SURGEON STRL SZ7 (GLOVE) ×3 IMPLANT
GLOVE BIO SURGEON STRL SZ8 (GLOVE) ×6 IMPLANT
GLOVE BIO SURGEON STRL SZ8.5 (GLOVE) ×6 IMPLANT
GLOVE ECLIPSE 7.5 STRL STRAW (GLOVE) ×9 IMPLANT
GLOVE EXAM NITRILE LRG STRL (GLOVE) IMPLANT
GLOVE EXAM NITRILE XL STR (GLOVE) IMPLANT
GLOVE EXAM NITRILE XS STR PU (GLOVE) IMPLANT
GLOVE INDICATOR 7.0 STRL GRN (GLOVE) IMPLANT
GLOVE INDICATOR 7.5 STRL GRN (GLOVE) ×15 IMPLANT
GLOVE INDICATOR 8.0 STRL GRN (GLOVE) ×9 IMPLANT
GLOVE SURG SS PI 7.0 STRL IVOR (GLOVE) ×12 IMPLANT
GOWN STRL REUS W/ TWL LRG LVL3 (GOWN DISPOSABLE) ×4 IMPLANT
GOWN STRL REUS W/ TWL XL LVL3 (GOWN DISPOSABLE) ×2 IMPLANT
GOWN STRL REUS W/TWL 2XL LVL3 (GOWN DISPOSABLE) ×6 IMPLANT
GOWN STRL REUS W/TWL LRG LVL3 (GOWN DISPOSABLE) ×8
GOWN STRL REUS W/TWL XL LVL3 (GOWN DISPOSABLE) ×4
KIT BASIN OR (CUSTOM PROCEDURE TRAY) ×3 IMPLANT
KIT ROOM TURNOVER OR (KITS) ×3 IMPLANT
NEEDLE HYPO 21X1.5 SAFETY (NEEDLE) ×3 IMPLANT
NEEDLE HYPO 22GX1.5 SAFETY (NEEDLE) ×3 IMPLANT
NS IRRIG 1000ML POUR BTL (IV SOLUTION) ×3 IMPLANT
OIL CARTRIDGE MAESTRO DRILL (MISCELLANEOUS) ×3
PACK LAMINECTOMY NEURO (CUSTOM PROCEDURE TRAY) ×3 IMPLANT
PAD ARMBOARD 7.5X6 YLW CONV (MISCELLANEOUS) ×9 IMPLANT
PATTIES SURGICAL .5 X1 (DISPOSABLE) IMPLANT
ROD REVERE 7.5MM (Rod) ×6 IMPLANT
SCREW 7.5X45MM (Screw) ×6 IMPLANT
SCREW 7.5X50MM (Screw) ×12 IMPLANT
SPACER ALTERA 10X31 8-12MM-8 (Spacer) ×3 IMPLANT
SPONGE LAP 4X18 X RAY DECT (DISPOSABLE) IMPLANT
SPONGE NEURO XRAY DETECT 1X3 (DISPOSABLE) IMPLANT
SPONGE SURGIFOAM ABS GEL 100 (HEMOSTASIS) ×3 IMPLANT
STRIP BIOACTIVE 10CC 25X50X8 (Miscellaneous) ×3 IMPLANT
STRIP BIOACTIVE 20CC 25X100X8 (Miscellaneous) ×3 IMPLANT
STRIP CLOSURE SKIN 1/2X4 (GAUZE/BANDAGES/DRESSINGS) ×2 IMPLANT
SUT VIC AB 1 CT1 18XBRD ANBCTR (SUTURE) ×2 IMPLANT
SUT VIC AB 1 CT1 8-18 (SUTURE) ×4
SUT VIC AB 2-0 CP2 18 (SUTURE) ×6 IMPLANT
SYRINGE 20CC LL (MISCELLANEOUS) ×3 IMPLANT
TAPE CLOTH SURG 6X10 WHT LF (GAUZE/BANDAGES/DRESSINGS) ×3 IMPLANT
TOWEL OR 17X24 6PK STRL BLUE (TOWEL DISPOSABLE) ×3 IMPLANT
TOWEL OR 17X26 10 PK STRL BLUE (TOWEL DISPOSABLE) ×3 IMPLANT
TRAY FOLEY W/METER SILVER 16FR (SET/KITS/TRAYS/PACK) ×3 IMPLANT
WATER STERILE IRR 1000ML POUR (IV SOLUTION) ×3 IMPLANT

## 2016-01-23 NOTE — Progress Notes (Signed)
Patient ID: Bryan Robles, male   DOB: 02-Sep-1980, 35 y.o.   MRN: [id number] Subjective:  The patient is somnolent but arousable. He is in no apparent distress.  Objective: Vital signs in last 24 hours: Temp:  [98.1 F (36.7 C)-98.7 F (37.1 C)] 98.7 F (37.1 C) (11/16 1602) Pulse Rate:  [68] 68 (11/16 0811) Resp:  [18] 18 (11/16 0811) BP: (139)/(68) 139/68 (11/16 0811) SpO2:  [100 %] 100 % (11/16 0811) Weight:  [86.7 kg (191 lb 1.6 oz)] 86.7 kg (191 lb 1.6 oz) (11/16 0811)  Intake/Output from previous day: No intake/output data recorded. Intake/Output this shift: Total I/O In: 2000 [I.V.:2000] Out: 1250 [Urine:1050; Blood:200]  Physical exam the patient is somnolent but arousable. He is moving his lower extremities well.  Lab Results: No results for input(s): WBC, HGB, HCT, PLT in the last 72 hours. BMET No results for input(s): NA, K, CL, CO2, GLUCOSE, BUN, CREATININE, CALCIUM in the last 72 hours.  Studies/Results: No results found.  Assessment/Plan: The patient is doing well. I spoke with his family.  LOS: 0 days     Bryan Robles D 01/23/2016, 4:14 PM

## 2016-01-23 NOTE — Anesthesia Procedure Notes (Addendum)
Procedure Name: Intubation Date/Time: 01/23/2016 10:59 AM Performed by: Roney MansSMITH, Jerel Sardina P Pre-anesthesia Checklist: Patient identified, Emergency Drugs available, Suction available, Patient being monitored and Timeout performed Patient Re-evaluated:Patient Re-evaluated prior to inductionOxygen Delivery Method: Circle system utilized Preoxygenation: Pre-oxygenation with 100% oxygen Intubation Type: IV induction Ventilation: Mask ventilation without difficulty Laryngoscope Size: Mac and 4 Grade View: Grade I Tube type: Oral Tube size: 7.5 mm Number of attempts: 1 Airway Equipment and Method: Stylet Placement Confirmation: ETT inserted through vocal cords under direct vision,  positive ETCO2 and breath sounds checked- equal and bilateral Secured at: 23 cm Tube secured with: Tape Dental Injury: Teeth and Oropharynx as per pre-operative assessment

## 2016-01-23 NOTE — Transfer of Care (Signed)
Immediate Anesthesia Transfer of Care Note  Patient: Octavia BrucknerBrad Cooley  Procedure(s) Performed: Procedure(s): POSTERIOR LUMBAR INTERBODY FUSION, INTERBODY PROSTHESIS,POSTERIOR LATERAL ARTHRODESIS,POSTERIOR SEGMENTAL INSTRUMENTATION LUMBAR FOUR-FIVE ,LUMBAR FIVE SACRAL ONE (N/A)  Patient Location: PACU  Anesthesia Type:General  Level of Consciousness: awake, alert , oriented and patient cooperative  Airway & Oxygen Therapy: Patient Spontanous Breathing and Patient connected to nasal cannula oxygen  Post-op Assessment: Report given to RN, Post -op Vital signs reviewed and stable and Patient moving all extremities X 4  Post vital signs: Reviewed and stable  Last Vitals:  Vitals:   01/23/16 0811  BP: 139/68  Pulse: 68  Resp: 18  Temp: 36.7 C    Last Pain:  Vitals:   01/23/16 0811  TempSrc: Oral  PainSc:          Complications: No apparent anesthesia complications

## 2016-01-23 NOTE — Progress Notes (Signed)
Patient arrived to unit, vitals taken and stable, mother present at bedside, continue to monitor patient.

## 2016-01-23 NOTE — H&P (Signed)
Subjective: The patient is a 35 year old black male who's had prior back surgeries. He has had chronic back pain which has failed medical management. He was worked up with a lumbar MRI which demonstrated disc degeneration, stenosis, etc. at L4-5 and L5-S1. I discussed the various treatment options with the patient including surgery. He has weighed the risks, benefits, and alternatives to surgery and decided proceed with an L4-5 and L5-S1 decompression, instrumentation, and fusion.   Past Medical History:  Diagnosis Date  . Arthritis   . Headache    migraines  . Hypertension     Past Surgical History:  Procedure Laterality Date  . BACK SURGERY    . WOUND EXPLORATION Right 11/13/2014   Procedure: WOUND EXPLORATION of Right Arm and Ligation of superficial vein.;  Surgeon: Nada LibmanVance W Brabham, MD;  Location: [facility] OR;  Service: Vascular;  Laterality: Right;    No Known Allergies  Social History  Substance Use Topics  . Smoking status: Current Every Day Smoker    Packs/day: 1.00    Years: 12.00    Types: Cigarettes  . Smokeless tobacco: Former NeurosurgeonUser    Quit date: 07/27/2013  . Alcohol use No    History reviewed. No pertinent family history. Prior to Admission medications   Medication Sig Start Date End Date Taking? Authorizing Provider  HYDROcodone-acetaminophen (NORCO) 10-325 MG tablet Take 1 tablet by mouth every 8 (eight) hours as needed for moderate pain.   Yes Historical Provider, MD  losartan (COZAAR) 100 MG tablet Take 100 mg by mouth daily.  09/20/14  Yes Historical Provider, MD  propranolol ER (INDERAL LA) 80 MG 24 hr capsule Take 80 mg by mouth daily.   Yes Historical Provider, MD  verapamil (CALAN-SR) 240 MG CR tablet Take 240 mg by mouth daily.   Yes Historical Provider, MD  oxyCODONE (OXY IR/ROXICODONE) 5 MG immediate release tablet Take 1 tablet (5 mg total) by mouth every 6 (six) hours as needed for severe pain. Patient not taking: Reported on 01/15/2016 12/12/14   Chuck Hinthristopher S  Dickson, MD     Review of Systems  Positive ROS: As above  All other systems have been reviewed and were otherwise negative with the exception of those mentioned in the HPI and as above.  Objective: Vital signs in last 24 hours: Temp:  [98.1 F (36.7 C)] 98.1 F (36.7 C) (11/16 0811) Pulse Rate:  [68] 68 (11/16 0811) Resp:  [18] 18 (11/16 0811) BP: (139)/(68) 139/68 (11/16 0811) SpO2:  [100 %] 100 % (11/16 0811) Weight:  [86.7 kg (191 lb 1.6 oz)] 86.7 kg (191 lb 1.6 oz) (11/16 0811)  General Appearance: Alert, cooperative, no distress, Head: Normocephalic, without obvious abnormality, atraumatic Eyes: PERRL, conjunctiva/corneas clear, EOM's intact,    Ears: Normal  Throat: Normal  Neck: Supple, symmetrical, trachea midline, no adenopathy; thyroid: No enlargement/tenderness/nodules; no carotid bruit or JVD Back: Symmetric, no curvature, ROM normal, no CVA tenderness. The patient's lumbar incision is well-healed. Lungs: Clear to auscultation bilaterally, respirations unlabored Heart: Regular rate and rhythm, no murmur, rub or gallop Abdomen: Soft, non-tender,, no masses, no organomegaly Extremities: Extremities normal, atraumatic, no cyanosis or edema Pulses: 2+ and symmetric all extremities Skin: Skin color, texture, turgor normal, no rashes or lesions  NEUROLOGIC:   Mental status: alert and oriented, no aphasia, good attention span, Fund of knowledge/ memory ok Motor Exam - grossly normal Sensory Exam - grossly normal Reflexes:  Coordination - grossly normal Gait - grossly normal Balance - grossly normal Cranial Nerves:  I: smell Not tested  II: visual acuity  OS: Normal  OD: Normal   II: visual fields Full to confrontation  II: pupils Equal, round, reactive to light  III,VII: ptosis None  III,IV,VI: extraocular muscles  Full ROM  V: mastication Normal  V: facial light touch sensation  Normal  V,VII: corneal reflex  Present  VII: facial muscle function - upper   Normal  VII: facial muscle function - lower Normal  VIII: hearing Not tested  IX: soft palate elevation  Normal  IX,X: gag reflex Present  XI: trapezius strength  5/5  XI: sternocleidomastoid strength 5/5  XI: neck flexion strength  5/5  XII: tongue strength  Normal    Data Review Lab Results  Component Value Date   WBC 6.5 01/15/2016   HGB 14.6 01/15/2016   HCT 42.5 01/15/2016   MCV 102.7 (H) 01/15/2016   PLT 240 01/15/2016   Lab Results  Component Value Date   NA 140 01/15/2016   K 4.3 01/15/2016   CL 107 01/15/2016   CO2 28 01/15/2016   BUN 11 01/15/2016   CREATININE 1.18 01/15/2016   GLUCOSE 77 01/15/2016   No results found for: INR, PROTIME  Assessment/Plan: L4-5 and L5-S1 degenerative disc disease, lumbago, lumbar radiculopathy: I discussed situation with the patient. I have reviewed his imaging studies with him and pointed out the abnormalities. We have discussed the various treatment options including surgery. I have described the surgical treatment option of an L4-5 and L5-S1 decompression, instrumentation, and fusion. I have shown him surgical models. We have discussed the risks, benefits, alternatives, expected postoperative course, and likelihood of achieving her goals with surgery. I have answered all the patient's questions. He has decided to proceed with surgery.   Fadi Menter D 01/23/2016 10:26 AM

## 2016-01-23 NOTE — Anesthesia Preprocedure Evaluation (Signed)
Anesthesia Evaluation  Patient identified by MRN, date of birth, ID band Patient awake    Reviewed: Allergy & Precautions, NPO status , Patient's Chart, lab work & pertinent test results, reviewed documented beta blocker date and time   Airway Mallampati: II  TM Distance: >3 FB Neck ROM: Full    Dental no notable dental hx. (+) Teeth Intact   Pulmonary Current Smoker,    Pulmonary exam normal breath sounds clear to auscultation       Cardiovascular hypertension, Pt. on medications and Pt. on home beta blockers Normal cardiovascular exam Rhythm:Regular Rate:Normal     Neuro/Psych  Headaches, negative psych ROS   GI/Hepatic negative GI ROS, Neg liver ROS,   Endo/Other  negative endocrine ROS  Renal/GU negative Renal ROS  negative genitourinary   Musculoskeletal  (+) Arthritis , Osteoarthritis,  DDD lumbar spine   Abdominal   Peds  Hematology negative hematology ROS (+)   Anesthesia Other Findings   Reproductive/Obstetrics                             Anesthesia Physical Anesthesia Plan  ASA: II  Anesthesia Plan: General   Post-op Pain Management:    Induction: Intravenous  Airway Management Planned: Oral ETT  Additional Equipment:   Intra-op Plan:   Post-operative Plan: Extubation in OR  Informed Consent: I have reviewed the patients History and Physical, chart, labs and discussed the procedure including the risks, benefits and alternatives for the proposed anesthesia with the patient or authorized representative who has indicated his/her understanding and acceptance.   Dental advisory given  Plan Discussed with: Anesthesiologist, CRNA and Surgeon  Anesthesia Plan Comments:         Anesthesia Quick Evaluation

## 2016-01-23 NOTE — Op Note (Signed)
Brief history: The patient is a 35 year old black male who has had previous lumbar surgery at L4-5 and L5-S1. He has had progressively worsening back and leg pain. He has failed medical management. He was worked up with a lumbar MRI which demonstrated herniated disc, degenerative disease, etc. at L4-5 and L5-S1. I discussed the various treatment options with the patient. He has weighed the risks, benefits, and alternative surgery and decided to proceed with an L4-5 and L5-S1 decompression, instrumentation, and fusion.  Preoperative diagnosis: L4-5 and L5-S1 herniated disks, Degenerative disc disease,  lumbago; lumbar radiculopathy  Postoperative diagnosis: Same   Procedure: Bilateral L4-5 and L5-S1 Laminotomy/foraminotomies/redo discectomy to decompress the bilateral L4, L5 and S1 nerve roots(the work required to do this was in addition to the work required to do the posterior lumbar interbody fusion because of the patient's recurrent herniated disc, facet arthropathy. Etc. requiring a wide decompression of the nerve roots.); L4-5 and L5-S1 transforaminal lumbar interbody fusion with local morselized autograft bone and Kinnex graft extender; insertion of interbody prosthesis at L4-5 and L5-S1 (globus peek expandable interbody prosthesis); posterior mental instrumentation from L4 to S1 with globus titanium pedicle screws and rods; posterior lateral arthrodesis at L4-5 and L5-S1 with local morselized autograft bone and Kinnex bone graft extender.  Surgeon: Dr. Delma OfficerJeff Monquie Fulgham  Asst.: Dr. Conchita ParisNundkumar  Anesthesia: Gen. endotracheal  Estimated blood loss: 250 mL  Drains: None   Complications: None  Description of procedure: The patient was brought to the operating room by the anesthesia team. General endotracheal anesthesia was induced. The patient was turned to the prone position on the Wilson frame. The patient's lumbosacral region was then prepared with Betadine scrub and Betadine solution. Sterile  drapes were applied.  I then injected the area to be incised with Marcaine with epinephrine solution. I then used the scalpel to make a linear midline incision over the L4-5 and L5-S1 interspace, incising through the old surgical scar. I then used electrocautery to perform a bilateral subperiosteal dissection exposing the spinous process and lamina of L4, L5 and the upper sacrum. We then obtained intraoperative radiograph to confirm our location. We then inserted the Verstrac retractor to provide exposure.  I began the decompression by using the high speed drill to perform laminotomies at L4-5 and L5-S1 bilaterally. We then used the Kerrison punches to widen the laminotomy and removed the tissue and ligamentum flavum at L4-5 and L5-S1 bilaterally. We used the Kerrison punches to remove the medial facets at L4-5 and L5-S1 bilaterally. We performed wide foraminotomies about the bilateral L4, L5 and S1 nerve roots completing the decompression.  We now turned our attention to the posterior lumbar interbody fusion. I used a scalpel to incise the intervertebral disc at L4-5 and L5-S1 bilaterally. I then performed a partial intervertebral discectomy at L4-5 and L5-S1 bilaterally using the pituitary forceps. We prepared the vertebral endplates at L4-5 and L5-S1 bilaterally for the fusion by removing the soft tissues with the curettes. We then used the trial spacers to pick the appropriate sized interbody prosthesis. We prefilled his prosthesis with a combination of local morselized autograft bone that we obtained during the decompression as well as Kinnex bone graft extender. We inserted the prefilled prosthesis into the interspace at L4-5 and L5-S1, we then expanded the prosthesis.. There was a good snug fit of the prosthesis in the interspace. We then filled and the remainder of the intervertebral disc space with local morselized autograft bone and Kinnex. This completed the posterior lumbar interbody  arthrodesis.  We now turned attention to the instrumentation. Under fluoroscopic guidance we cannulated the bilateral L4, L5 and the S1 pedicles with the bone probe. We then removed the bone probe. We then tapped the pedicle with a 6.5 millimeter tap. We then removed the tap. We probed inside the tapped pedicle with a ball probe to rule out cortical breaches. We then inserted a 7.5 x 45 and 50 millimeter pedicle screw into the L4, L5 and S1 pedicles bilaterally under fluoroscopic guidance. We then palpated along the medial aspect of the pedicles to rule out cortical breaches. There were none. The nerve roots were not injured. We then connected the unilateral pedicle screws with a lordotic rod. We compressed the construct and secured the rod in place with the caps. We then tightened the caps appropriately. This completed the instrumentation from L4-S1.  We now turned our attention to the posterior lateral arthrodesis at L4-5 and L5-S1 bilaterally. We used the high-speed drill to decorticate the remainder of the facets, pars, transverse process at L4-5 and L5-S1 bilaterally. We then applied a combination of local morselized autograft bone and Kinnex bone graft extender over these decorticated posterior lateral structures. This completed the posterior lateral arthrodesis.  We then obtained hemostasis using bipolar electrocautery. We irrigated the wound out with bacitracin solution. We inspected the thecal sac and nerve roots and noted they were well decompressed. We then removed the retractor. We placed vancomycin powder in the wound. We reapproximated patient's thoracolumbar fascia with interrupted #1 Vicryl suture. We reapproximated patient's subcutaneous tissue with interrupted 2-0 Vicryl suture. The reapproximated patient's skin with Steri-Strips and benzoin. The wound was then coated with bacitracin ointment. A sterile dressing was applied. The drapes were removed. The patient was subsequently returned to  the supine position where they were extubated by the anesthesia team. He was then transported to the post anesthesia care unit in stable condition. All sponge instrument and needle counts were reportedly correct at the end of this case.

## 2016-01-23 NOTE — Anesthesia Postprocedure Evaluation (Signed)
Anesthesia Post Note  Patient: Bryan BrucknerBrad Wirkkala  Procedure(s) Performed: Procedure(s) (LRB): POSTERIOR LUMBAR INTERBODY FUSION, INTERBODY PROSTHESIS,POSTERIOR LATERAL ARTHRODESIS,POSTERIOR SEGMENTAL INSTRUMENTATION LUMBAR FOUR-FIVE ,LUMBAR FIVE SACRAL ONE (N/A)  Patient location during evaluation: PACU Anesthesia Type: General Level of consciousness: awake and alert Pain management: pain level controlled Vital Signs Assessment: post-procedure vital signs reviewed and stable Respiratory status: spontaneous breathing, nonlabored ventilation, respiratory function stable and patient connected to nasal cannula oxygen Cardiovascular status: blood pressure returned to baseline and stable Postop Assessment: no signs of nausea or vomiting Anesthetic complications: yes Anesthetic complication details: Pt with right eye irritation. spoke with pt about risks of abrasions under GA and ordered eye drops. Discussed possible need  for opthalmalogy consult. and injury of cornea   Last Vitals:  Vitals:   01/23/16 1715 01/23/16 1719  BP:  (!) 92/54  Pulse: 72 70  Resp: (!) 9 14  Temp:      Last Pain:  Vitals:   01/23/16 1700  TempSrc:   PainSc: 10-Worst pain ever                 Kennieth RadFitzgerald, Elinore Shults E

## 2016-01-24 ENCOUNTER — Inpatient Hospital Stay (HOSPITAL_COMMUNITY): Payer: No Typology Code available for payment source

## 2016-01-24 LAB — CBC
HCT: 37.7 % — ABNORMAL LOW (ref 39.0–52.0)
Hemoglobin: 12.8 g/dL — ABNORMAL LOW (ref 13.0–17.0)
MCH: 34.7 pg — AB (ref 26.0–34.0)
MCHC: 34 g/dL (ref 30.0–36.0)
MCV: 102.2 fL — ABNORMAL HIGH (ref 78.0–100.0)
PLATELETS: 238 10*3/uL (ref 150–400)
RBC: 3.69 MIL/uL — AB (ref 4.22–5.81)
RDW: 12.7 % (ref 11.5–15.5)
WBC: 13.8 10*3/uL — ABNORMAL HIGH (ref 4.0–10.5)

## 2016-01-24 LAB — BASIC METABOLIC PANEL
Anion gap: 9 (ref 5–15)
BUN: 17 mg/dL (ref 6–20)
CALCIUM: 8.8 mg/dL — AB (ref 8.9–10.3)
CO2: 26 mmol/L (ref 22–32)
CREATININE: 1.42 mg/dL — AB (ref 0.61–1.24)
Chloride: 101 mmol/L (ref 101–111)
GFR calc Af Amer: >60 mL/min (ref >60)
Glucose, Bld: 108 mg/dL — ABNORMAL HIGH (ref 65–99)
POTASSIUM: 4 mmol/L (ref 3.5–5.1)
SODIUM: 136 mmol/L (ref 135–145)

## 2016-01-24 MED ORDER — HYDROMORPHONE HCL 1 MG/ML IJ SOLN
1.0000 mg | INTRAMUSCULAR | Status: DC | PRN
  Administered 2016-01-24 – 2016-01-26 (×7): 1 mg via INTRAVENOUS
  Filled 2016-01-24 (×7): qty 1

## 2016-01-24 MED ORDER — INFLUENZA VAC SPLIT QUAD 0.5 ML IM SUSY
0.5000 mL | PREFILLED_SYRINGE | INTRAMUSCULAR | Status: AC
  Administered 2016-01-25: 0.5 mL via INTRAMUSCULAR
  Filled 2016-01-24: qty 0.5

## 2016-01-24 MED ORDER — PNEUMOCOCCAL VAC POLYVALENT 25 MCG/0.5ML IJ INJ
0.5000 mL | INJECTION | INTRAMUSCULAR | Status: AC
  Administered 2016-01-25: 0.5 mL via INTRAMUSCULAR
  Filled 2016-01-24: qty 0.5

## 2016-01-24 MED FILL — Heparin Sodium (Porcine) Inj 1000 Unit/ML: INTRAMUSCULAR | Qty: 30 | Status: AC

## 2016-01-24 MED FILL — Sodium Chloride IV Soln 0.9%: INTRAVENOUS | Qty: 1000 | Status: AC

## 2016-01-24 NOTE — Progress Notes (Signed)
Patient foley d/c'd; ambulated in hallway with brace with staff assist and RW approximately 100 yards. Continue to monitor.

## 2016-01-24 NOTE — Progress Notes (Signed)
Patient ID: Bryan Robles, male   DOB: Apr 09, 1980, 35 y.o.   MRN: [id number] Subjective:  The patient is alert and pleasant. He looks well. His back is sore.  Objective: Vital signs in last 24 hours: Temp:  [97.5 F (36.4 C)-99 F (37.2 C)] 97.7 F (36.5 C) (11/17 0540) Pulse Rate:  [63-78] 63 (11/17 0540) Resp:  [6-18] 18 (11/17 0540) BP: (92-139)/(54-95) 115/71 (11/17 0540) SpO2:  [92 %-100 %] 97 % (11/17 0540) Weight:  [86.7 kg (191 lb 1.6 oz)-87.9 kg (193 lb 11.2 oz)] 87.9 kg (193 lb 11.2 oz) (11/16 1954)  Intake/Output from previous day: 11/16 0701 - 11/17 0700 In: 2733.6 [I.V.:2633.6; IV Piggyback:100] Out: 2950 [Urine:2750; Blood:200] Intake/Output this shift: No intake/output data recorded.  Physical exam the patient is alert and oriented. He is moving his lower extremities well.  Lab Results:  Recent Labs  01/24/16 0210  WBC 13.8*  HGB 12.8*  HCT 37.7*  PLT 238   BMET  Recent Labs  01/24/16 0210  NA 136  K 4.0  CL 101  CO2 26  GLUCOSE 108*  BUN 17  CREATININE 1.42*  CALCIUM 8.8*    Studies/Results: Dg Lumbar Spine 2-3 Views  Result Date: 01/23/2016 CLINICAL DATA:  PLIF L4-L5 and L5-S1. EXAM: LUMBAR SPINE - 2-3 VIEW; DG C-ARM 61-120 MIN COMPARISON:  None. FINDINGS: Two fluoroscopic spot images obtained in the operating room in frontal and lateral projections demonstrate intrapedicular screws at L4, L5, and S1. Interbody spacers are in place at L4-L5 and L5-S1. Alignment is maintained. Fluoroscopy time 0 minutes 33 seconds. IMPRESSION: Intraoperative fluoroscopic spot images PLIF L4-L5 and L5-S1. Electronically Signed   By: Rubye OaksMelanie  Ehinger M.D.   On: 01/23/2016 16:24   Dg C-arm 61-120 Min  Result Date: 01/23/2016 CLINICAL DATA:  PLIF L4-L5 and L5-S1. EXAM: LUMBAR SPINE - 2-3 VIEW; DG C-ARM 61-120 MIN COMPARISON:  None. FINDINGS: Two fluoroscopic spot images obtained in the operating room in frontal and lateral projections demonstrate intrapedicular screws  at L4, L5, and S1. Interbody spacers are in place at L4-L5 and L5-S1. Alignment is maintained. Fluoroscopy time 0 minutes 33 seconds. IMPRESSION: Intraoperative fluoroscopic spot images PLIF L4-L5 and L5-S1. Electronically Signed   By: Rubye OaksMelanie  Ehinger M.D.   On: 01/23/2016 16:24    Assessment/Plan: Postop day #1: The patient is doing well. He may go home tomorrow.  LOS: 1 day     Brogan Martis D 01/24/2016, 7:46 AM

## 2016-01-24 NOTE — Evaluation (Signed)
Physical Therapy Evaluation Patient Details Name: Bryan BrucknerBrad Samet MRN: [id number] DOB: 1980-12-05 Today's Date: 01/24/2016   History of Present Illness  patient is a 35 yo male s/p Bilateral L4-5 and L5-S1 Laminotomy/foraminotomies/redo discectomy. hisotry of multiple spinal surgeries  Clinical Impression  Patient seen for mobility assessment and education s/p spinal surgery. Patient mobilizing well but impulsive with activity. Educated patient regarding mobility precautions, activity expectations and safety. At this time patient observed mobilizing independently in room as well as mobilizing with RW in hall. Patient was able to perform stair negotation and was receptive to education re:car transfers. At this time, do not feel patient requires further acute PT. Will sign off.  Notified nursing of concerns as patient was desiring therapist to take him outside to smoke, advised that smoking was not permitted on hospital grounds. Patient attempting to open window in room, again advised patient that smoking in room was not permitted and educated patient on the risk and impact of smoking on healing process. Nsg aware.    Follow Up Recommendations No PT follow up;Supervision - Intermittent    Equipment Recommendations  None recommended by PT    Recommendations for Other Services       Precautions / Restrictions Precautions Precautions: Back Precaution Booklet Issued: Yes (comment) Precaution Comments: verbally reviewed with patient Required Braces or Orthoses: Spinal Brace Spinal Brace: Lumbar corset      Mobility  Bed Mobility Overal bed mobility: Independent             General bed mobility comments: no physical assist required  Transfers Overall transfer level: Independent Equipment used: None             General transfer comment: patient able to stand up without difficulty. Observed patient come to stand 3x during session without device or cues, appears painful during  transitions.  Ambulation/Gait Ambulation/Gait assistance: Modified independent (Device/Increase time) Ambulation Distance (Feet): 180 Feet Assistive device: Rolling walker (2 wheeled) (in room ambulation without any device) Gait Pattern/deviations: Step-through pattern;Decreased stride length;Narrow base of support Gait velocity: decreased Gait velocity interpretation: Below normal speed for age/gender General Gait Details: patient observed in room ambulating without difficulty despite increased pain. not using device for mobility. Patient did request use of RW for comfort, provided during in hall ambulation, minimal observed use  Stairs Stairs: Yes Stairs assistance: Supervision Stair Management: One rail Left;Step to pattern Number of Stairs: 4 General stair comments: no physical assist required, cued for sequencing   Wheelchair Mobility    Modified Rankin (Stroke Patients Only)       Balance Overall balance assessment: No apparent balance deficits (not formally assessed)                                           Pertinent Vitals/Pain Pain Assessment: Faces Faces Pain Scale: Hurts whole lot Pain Location: surgical site Pain Descriptors / Indicators: Spasm Pain Intervention(s): Monitored during session    Home Living Family/patient expects to be discharged to:: Private residence Living Arrangements: Parent Available Help at Discharge: Family Type of Home: House Home Access: Stairs to enter Entrance Stairs-Rails: Left Entrance Stairs-Number of Steps: 6 Home Layout: One level Home Equipment: Cane - single point      Prior Function Level of Independence: Independent               Hand Dominance   Dominant Hand: Right  Extremity/Trunk Assessment   Upper Extremity Assessment: Overall WFL for tasks assessed           Lower Extremity Assessment: Overall WFL for tasks assessed      Cervical / Trunk Assessment:  (s/p spinal  surgery)  Communication   Communication: No difficulties  Cognition Arousal/Alertness: Awake/alert Behavior During Therapy: Impulsive Overall Cognitive Status: Within Functional Limits for tasks assessed                      General Comments      Exercises     Assessment/Plan    PT Assessment Patent does not need any further PT services  PT Problem List            PT Treatment Interventions      PT Goals (Current goals can be found in the Care Plan section)  Acute Rehab PT Goals PT Goal Formulation: All assessment and education complete, DC therapy    Frequency     Barriers to discharge        Co-evaluation               End of Session Equipment Utilized During Treatment: Back brace Activity Tolerance: Patient limited by pain Patient left: in chair;with call bell/phone within reach Nurse Communication: Mobility status         Time: [id number] PT Time Calculation (min) (ACUTE ONLY): 21 min   Charges:   PT Evaluation $PT Eval Low Complexity: 1 Procedure     PT G Codes:        Fabio AsaDevon J Arbie Reisz 01/24/2016, 9:11 AM Charlotte Crumbevon Ashlynn Gunnels, PT DPT  [phone]

## 2016-01-25 MED ORDER — NICOTINE 7 MG/24HR TD PT24
7.0000 mg | MEDICATED_PATCH | TRANSDERMAL | Status: DC
  Administered 2016-01-26 (×2): 7 mg via TRANSDERMAL
  Filled 2016-01-25 (×4): qty 1

## 2016-01-25 NOTE — Plan of Care (Signed)
Problem: Pain Managment: Goal: General experience of comfort will improve Outcome: Progressing  Further pt teaching provided about adequete pain management - advised pt not to do extenuating activity without being properly medicated for pain to manage the activity . advsied pt that he would not be dischareged on iv pain medication  Advised to manage pain with oral medications as needed and not to extend pain request out as this is more difficult to manage thus require immediate relief with iv medication . Pt verbalize understanding

## 2016-01-25 NOTE — Progress Notes (Signed)
Patient ID: Bryan BrucknerBrad Robles, male   DOB: 1980/04/01, 35 y.o.   MRN: [id number] Subjective:  The patient is alert and pleasant. His back is appropriately sore.  Objective: Vital signs in last 24 hours: Temp:  [97.8 F (36.6 C)-98.8 F (37.1 C)] 98.5 F (36.9 C) (11/18 0533) Pulse Rate:  [68-76] 72 (11/18 0533) Resp:  [17-20] 20 (11/18 0533) BP: (116-124)/(61-74) 124/74 (11/18 0533) SpO2:  [97 %-99 %] 98 % (11/18 0533)  Intake/Output from previous day: 11/17 0701 - 11/18 0700 In: 480 [P.O.:480] Out: 750 [Urine:750] Intake/Output this shift: No intake/output data recorded.  Physical exam the patient is alert and oriented. His dressing is clean and dry. His strength is normal in his lower extremities.  Lab Results:  Recent Labs  01/24/16 0210  WBC 13.8*  HGB 12.8*  HCT 37.7*  PLT 238   BMET  Recent Labs  01/24/16 0210  NA 136  K 4.0  CL 101  CO2 26  GLUCOSE 108*  BUN 17  CREATININE 1.42*  CALCIUM 8.8*    Studies/Results: Dg Lumbar Spine 2-3 Views  Result Date: 01/23/2016 CLINICAL DATA:  PLIF L4-L5 and L5-S1. EXAM: LUMBAR SPINE - 2-3 VIEW; DG C-ARM 61-120 MIN COMPARISON:  None. FINDINGS: Two fluoroscopic spot images obtained in the operating room in frontal and lateral projections demonstrate intrapedicular screws at L4, L5, and S1. Interbody spacers are in place at L4-L5 and L5-S1. Alignment is maintained. Fluoroscopy time 0 minutes 33 seconds. IMPRESSION: Intraoperative fluoroscopic spot images PLIF L4-L5 and L5-S1. Electronically Signed   By: Rubye OaksMelanie  Ehinger M.D.   On: 01/23/2016 16:24   Dg Lumbar Spine 1 View  Result Date: 01/24/2016 CLINICAL DATA:  PLIF L4-S1 EXAM: LUMBAR SPINE - 1 VIEW COMPARISON:  Lumbar MRI 08/09/2015 FINDINGS: Using the numbering convention of the comparison lumbar MRI, a blunt tip probe is posterior to the L5 vertebral body. IMPRESSION: Intraoperative radiograph as above. Electronically Signed   By: Genevive BiStewart  Edmunds M.D.   On: 01/24/2016 09:55    Dg C-arm 61-120 Min  Result Date: 01/23/2016 CLINICAL DATA:  PLIF L4-L5 and L5-S1. EXAM: LUMBAR SPINE - 2-3 VIEW; DG C-ARM 61-120 MIN COMPARISON:  None. FINDINGS: Two fluoroscopic spot images obtained in the operating room in frontal and lateral projections demonstrate intrapedicular screws at L4, L5, and S1. Interbody spacers are in place at L4-L5 and L5-S1. Alignment is maintained. Fluoroscopy time 0 minutes 33 seconds. IMPRESSION: Intraoperative fluoroscopic spot images PLIF L4-L5 and L5-S1. Electronically Signed   By: Rubye OaksMelanie  Ehinger M.D.   On: 01/23/2016 16:24    Assessment/Plan: Postoperative day #2: The patient is doing well. We will continue to mobilize him. He may go home tomorrow.  LOS: 2 days     Bryan Robles D 01/25/2016, 7:55 AM

## 2016-01-26 MED ORDER — OXYCODONE-ACETAMINOPHEN 7.5-325 MG PO TABS
1.0000 | ORAL_TABLET | ORAL | Status: DC | PRN
  Administered 2016-01-26 – 2016-01-27 (×6): 2 via ORAL
  Filled 2016-01-26 (×6): qty 2

## 2016-01-26 MED ORDER — GABAPENTIN 300 MG PO CAPS
300.0000 mg | ORAL_CAPSULE | Freq: Three times a day (TID) | ORAL | Status: DC
  Administered 2016-01-26 – 2016-01-27 (×4): 300 mg via ORAL
  Filled 2016-01-26 (×4): qty 1

## 2016-01-26 NOTE — Progress Notes (Signed)
No acute events Still having a fair amount of back pain Not ready to go home Moving legs well Dressings c/d/i Will adjust pain medications

## 2016-01-26 NOTE — Progress Notes (Signed)
During report pt appears off unit. NT confirms appearance stating that she saw patient walking down hallway away from unit. RN informed and educated patient yesterday that patient is required to stay on unit unless given orders by doctor that allows patient to walk off unit. Patient verbalized understanding of instructions yesterday and gave indication that patient would be willing to comply. RN also noticed that upon return to the unit yesterday, that patient smelled of cigarette smoke. RN inquired patient if he smoked at home to which patient stated that he did. Later questioning revealed that patient smokes around 4 cigarettes per day. RN called on call MD French Anaracy to inform and ask about getting patient nicotine patch to relieve cravings. French Anaracy verbalized orders to be put in for patch that patient was scheduled to receive at 1900 on 01/25/16. According to nightshift RN Joni ReiningNicole, patient refused nicotine patch. Will attempt to re-educate patient about the importance of staying on the unit and will attempt to offer nicotine patch again to relieve nicotine cravings. RN will continue to monitor.

## 2016-01-26 NOTE — Progress Notes (Signed)
Pt pain does not seem to be under control - he states he has had several back surgeries and this by far feels differently . He reports being able to tolerate pain well , however has been unable to tolerate the pain affected by this surgery . Pt has been getting pain management with different therapys as he says he is not feeling the effect from the pain medication adminsistered from previous treatments  . Nonpahrmacological therapies encouraged ( repositioning , cold compress, guided imagery and deep breathing exercises - pt only expresses relief for a short period . Pt has been receiving pain medication appx evry 2 hr with no relief . He stated he did not feel comfortable going home in this state and would notify the md .

## 2016-01-26 NOTE — Progress Notes (Signed)
*  Correction to previous note: MD Ditty gave order for Nicotine patch.

## 2016-01-27 MED ORDER — GABAPENTIN 300 MG PO CAPS: 300.0000 mg | ORAL_CAPSULE | Freq: Three times a day (TID) | ORAL | 1 refills | Status: DC

## 2016-01-27 MED ORDER — OXYCODONE-ACETAMINOPHEN 7.5-325 MG PO TABS: 1.0000 | ORAL_TABLET | ORAL | 0 refills | Status: DC | PRN

## 2016-01-27 MED ORDER — DOCUSATE SODIUM 100 MG PO CAPS: 100.0000 mg | ORAL_CAPSULE | Freq: Two times a day (BID) | ORAL | 0 refills | Status: DC

## 2016-01-27 MED ORDER — CYCLOBENZAPRINE HCL 10 MG PO TABS: 10.0000 mg | ORAL_TABLET | Freq: Three times a day (TID) | ORAL | Status: DC | PRN

## 2016-01-27 MED ORDER — CYCLOBENZAPRINE HCL 10 MG PO TABS: 10.0000 mg | ORAL_TABLET | Freq: Three times a day (TID) | ORAL | 1 refills | Status: DC | PRN

## 2016-01-27 NOTE — Progress Notes (Signed)
Discharge instructions reviewed with patient and mother. Emphasis placed on NO bending, lifting and/or twisting. Patient is also reminded to put brace on when he is up and walking around.

## 2016-01-27 NOTE — Plan of Care (Signed)
Problem: Education: Goal: Knowledge of [facility] Education information/materials will improve Outcome: Progressing POC reviewed with pt.; pt. Wants a walker at d/c.

## 2016-01-27 NOTE — Discharge Summary (Signed)
Physician Discharge Summary  Patient ID: Bryan BrucknerBrad Robles MRN: [id number] DOB/AGE: [birth year] 35 y.o.  Admit date: 01/23/2016 Discharge date: 01/27/2016  Admission Diagnoses:L4-5 and L5-S1 degenerative disc disease, lumbago, lumbar radiculopathy  Discharge Diagnoses: The same Active Problems:   Lumbar herniated disc   Discharged Condition: good  Hospital Course: I performed an L4-5 and L5-S1 redo decompression, instrumentation, and fusion on the patient on 01/23/2016. The surgery went well.  The patient's postoperative course was unremarkable.  On 01/27/2016 the patient requested discharge to home. He was given written and oral discharge instructions. All his questions were answered.   Consults: Physical therapy Significant Diagnostic Studies: None Treatments: L4-5 and L5-S1 decompression, instrumentation, and fusion. Discharge Exam: Blood pressure 127/70, pulse 88, temperature 98.7 F (37.1 C), temperature source Oral, resp. rate 16, height 6\' 1"  (1.854 m), weight 87.9 kg (193 lb 11.2 oz), SpO2 95 %. The patient is alert and pleasant. His wound is healing well. His strength is grossly normal in his lower extremities.  Disposition: Home  Discharge Instructions    Call MD for:  difficulty breathing, headache or visual disturbances    Complete by:  As directed    Call MD for:  extreme fatigue    Complete by:  As directed    Call MD for:  hives    Complete by:  As directed    Call MD for:  persistant dizziness or light-headedness    Complete by:  As directed    Call MD for:  persistant nausea and vomiting    Complete by:  As directed    Call MD for:  redness, tenderness, or signs of infection (pain, swelling, redness, odor or green/yellow discharge around incision site)    Complete by:  As directed    Call MD for:  severe uncontrolled pain    Complete by:  As directed    Call MD for:  temperature >100.4    Complete by:  As directed    Diet - low sodium heart healthy     Complete by:  As directed    Discharge instructions    Complete by:  As directed    Call [phone] for a followup appointment. Take a stool softener while you are using pain medications.   Driving Restrictions    Complete by:  As directed    Do not drive for 2 weeks.   Increase activity slowly    Complete by:  As directed    Lifting restrictions    Complete by:  As directed    Do not lift more than 5 pounds. No excessive bending or twisting.   May shower / Bathe    Complete by:  As directed    He may shower after the pain she is removed 3 days after surgery. Leave the incision alone.   No dressing needed    Complete by:  As directed        Medication List    STOP taking these medications   HYDROcodone-acetaminophen 10-325 MG tablet Commonly known as:  NORCO   oxyCODONE 5 MG immediate release tablet Commonly known as:  Oxy IR/ROXICODONE     TAKE these medications   cyclobenzaprine 10 MG tablet Commonly known as:  FLEXERIL Take 1 tablet (10 mg total) by mouth 3 (three) times daily as needed for muscle spasms.   docusate sodium 100 MG capsule Commonly known as:  COLACE Take 1 capsule (100 mg total) by mouth 2 (two) times daily.   gabapentin 300 MG capsule  Commonly known as:  NEURONTIN Take 1 capsule (300 mg total) by mouth 3 (three) times daily.   losartan 100 MG tablet Commonly known as:  COZAAR Take 100 mg by mouth daily.   oxyCODONE-acetaminophen 7.5-325 MG tablet Commonly known as:  PERCOCET Take 1-2 tablets by mouth every 4 (four) hours as needed for severe pain.   propranolol ER 80 MG 24 hr capsule Commonly known as:  INDERAL LA Take 80 mg by mouth daily.   verapamil 240 MG CR tablet Commonly known as:  CALAN-SR Take 240 mg by mouth daily.            Durable Medical Equipment        Start     Ordered   01/27/16 1343  For home use only DME 4 wheeled rolling walker with seat  Once    Question:  Patient needs a walker to treat with the  following condition  Answer:  Lumbar degenerative disc disease   01/27/16 1342       Signed: [name],[name] 01/27/2016, 5:09 PM

## 2017-07-08 ENCOUNTER — Emergency Department (HOSPITAL_COMMUNITY): Payer: Medicare HMO

## 2017-07-08 ENCOUNTER — Encounter (HOSPITAL_COMMUNITY): Payer: Self-pay

## 2017-07-08 ENCOUNTER — Other Ambulatory Visit: Payer: Self-pay

## 2017-07-08 ENCOUNTER — Emergency Department (HOSPITAL_COMMUNITY)
Admission: EM | Admit: 2017-07-08 | Discharge: 2017-07-08 | Disposition: A | Discharge status: Home / Self Care | Payer: Medicare HMO | Attending: Emergency Medicine | Admitting: Emergency Medicine

## 2017-07-08 DIAGNOSIS — F17210 Nicotine dependence, cigarettes, uncomplicated: Secondary | ICD-10-CM | POA: Diagnosis not present

## 2017-07-08 DIAGNOSIS — I10 Essential (primary) hypertension: Secondary | ICD-10-CM | POA: Insufficient documentation

## 2017-07-08 DIAGNOSIS — M5412 Radiculopathy, cervical region: Secondary | ICD-10-CM | POA: Diagnosis not present

## 2017-07-08 DIAGNOSIS — R079 Chest pain, unspecified: Secondary | ICD-10-CM | POA: Diagnosis not present

## 2017-07-08 DIAGNOSIS — Z79899 Other long term (current) drug therapy: Secondary | ICD-10-CM | POA: Diagnosis not present

## 2017-07-08 DIAGNOSIS — R0789 Other chest pain: Secondary | ICD-10-CM

## 2017-07-08 DIAGNOSIS — M5414 Radiculopathy, thoracic region: Secondary | ICD-10-CM | POA: Diagnosis not present

## 2017-07-08 LAB — COMPREHENSIVE METABOLIC PANEL
ALT: 28 U/L (ref 17–63)
AST: 22 U/L (ref 15–41)
Albumin: 4.1 g/dL (ref 3.5–5.0)
Alkaline Phosphatase: 58 U/L (ref 38–126)
Anion gap: 12 (ref 5–15)
BILIRUBIN TOTAL: 0.8 mg/dL (ref 0.3–1.2)
BUN: 17 mg/dL (ref 6–20)
CALCIUM: 10.1 mg/dL (ref 8.9–10.3)
CO2: 22 mmol/L (ref 22–32)
CREATININE: 1.12 mg/dL (ref 0.61–1.24)
Chloride: 103 mmol/L (ref 101–111)
GFR calc non Af Amer: >60 mL/min (ref >60)
Glucose, Bld: 136 mg/dL — ABNORMAL HIGH (ref 65–99)
Potassium: 3.9 mmol/L (ref 3.5–5.1)
Sodium: 137 mmol/L (ref 135–145)
TOTAL PROTEIN: 7.5 g/dL (ref 6.5–8.1)

## 2017-07-08 LAB — CBC WITH DIFFERENTIAL/PLATELET
BASOS ABS: 0 10*3/uL (ref 0.0–0.1)
BASOS PCT: 0 %
EOS ABS: 0 10*3/uL (ref 0.0–0.7)
EOS PCT: 0 %
HCT: 39.1 % (ref 39.0–52.0)
Hemoglobin: 12.9 g/dL — ABNORMAL LOW (ref 13.0–17.0)
Lymphocytes Relative: 13 %
Lymphs Abs: 1.4 10*3/uL (ref 0.7–4.0)
MCH: 34.5 pg — AB (ref 26.0–34.0)
MCHC: 33 g/dL (ref 30.0–36.0)
MCV: 104.5 fL — ABNORMAL HIGH (ref 78.0–100.0)
Monocytes Absolute: 0.6 10*3/uL (ref 0.1–1.0)
Monocytes Relative: 6 %
NEUTROS PCT: 81 %
Neutro Abs: 8.8 10*3/uL — ABNORMAL HIGH (ref 1.7–7.7)
PLATELETS: 246 10*3/uL (ref 150–400)
RBC: 3.74 MIL/uL — AB (ref 4.22–5.81)
RDW: 13.3 % (ref 11.5–15.5)
WBC: 10.9 10*3/uL — AB (ref 4.0–10.5)

## 2017-07-08 LAB — TROPONIN I

## 2017-07-08 MED ORDER — KETOROLAC TROMETHAMINE 30 MG/ML IJ SOLN
30.0000 mg | Freq: Once | INTRAMUSCULAR | Status: AC
  Administered 2017-07-08: 30 mg via INTRAVENOUS
  Filled 2017-07-08: qty 1

## 2017-07-08 MED ORDER — KETOROLAC TROMETHAMINE 10 MG PO TABS: 10.0000 mg | ORAL_TABLET | Freq: Four times a day (QID) | ORAL | 0 refills | Status: DC | PRN

## 2017-07-08 MED ORDER — DEXAMETHASONE SODIUM PHOSPHATE 10 MG/ML IJ SOLN
10.0000 mg | Freq: Once | INTRAMUSCULAR | Status: AC
  Administered 2017-07-08: 10 mg via INTRAVENOUS
  Filled 2017-07-08: qty 1

## 2017-07-08 MED ORDER — PREDNISONE 10 MG (21) PO TBPK: ORAL_TABLET | Freq: Every day | ORAL | 0 refills | Status: DC

## 2017-07-08 NOTE — ED Provider Notes (Signed)
[facility] EMERGENCY DEPARTMENT Provider Note   CSN: [id number] Arrival date & time: 07/08/17  1121     History   Chief Complaint Chief Complaint  Patient presents with  . Chest Pain    HPI Bryan Robles is a 37 y.o. male.  Pt presents to the ED today with CP.  Sx have been going on for the past few months.  He said he has a hx of hx of degenerative disc disease in his neck and back.  He was told he will get cp from it.  He had some pain to his left arm as well and wanted to make sure he was not having a heart attack.  No sob.  No n/v.     Past Medical History:  Diagnosis Date  . Arthritis   . Headache    migraines  . Hypertension     Patient Active Problem List   Diagnosis Date Noted  . Lumbar herniated disc 01/23/2016  . Laceration of right arm with complication 11/14/2014    Past Surgical History:  Procedure Laterality Date  . BACK SURGERY    . WOUND EXPLORATION Right 11/13/2014   Procedure: WOUND EXPLORATION of Right Arm and Ligation of superficial vein.;  Surgeon: Nada Libman, MD;  Location: [facility] OR;  Service: Vascular;  Laterality: Right;        Home Medications    Prior to Admission medications   Medication Sig Start Date End Date Taking? Authorizing Provider  cyclobenzaprine (FLEXERIL) 10 MG tablet Take 1 tablet (10 mg total) by mouth 3 (three) times daily as needed for muscle spasms. 01/27/16   Tressie Stalker, MD  docusate sodium (COLACE) 100 MG capsule Take 1 capsule (100 mg total) by mouth 2 (two) times daily. 01/27/16   Tressie Stalker, MD  gabapentin (NEURONTIN) 300 MG capsule Take 1 capsule (300 mg total) by mouth 3 (three) times daily. 01/27/16   Tressie Stalker, MD  ketorolac (TORADOL) 10 MG tablet Take 1 tablet (10 mg total) by mouth every 6 (six) hours as needed. 07/08/17   Jacalyn Lefevre, MD  losartan (COZAAR) 100 MG tablet Take 100 mg by mouth daily.  09/20/14   [provider]  oxyCODONE-acetaminophen (PERCOCET) 7.5-325 MG tablet  Take 1-2 tablets by mouth every 4 (four) hours as needed for severe pain. 01/27/16   Tressie Stalker, MD  predniSONE (STERAPRED UNI-PAK 21 TAB) 10 MG (21) TBPK tablet Take by mouth daily. Take 6 tabs by mouth daily  for 2 days, then 5 tabs for 2 days, then 4 tabs for 2 days, then 3 tabs for 2 days, 2 tabs for 2 days, then 1 tab by mouth daily for 2 days 07/08/17   Jacalyn Lefevre, MD  propranolol ER (INDERAL LA) 80 MG 24 hr capsule Take 80 mg by mouth daily.    [provider]  verapamil (CALAN-SR) 240 MG CR tablet Take 240 mg by mouth daily.    [provider]    Family History No family history on file.  Social History Social History   Tobacco Use  . Smoking status: Current Every Day Smoker    Packs/day: 1.00    Years: 12.00    Pack years: 12.00    Types: Cigarettes  . Smokeless tobacco: Former Neurosurgeon    Quit date: 07/27/2013  Substance Use Topics  . Alcohol use: No    Alcohol/week: 0.0 oz  . Drug use: No     Allergies   No known allergies  Review of Systems Review of Systems  Cardiovascular: Positive for chest pain.  Musculoskeletal:       Left arm pain (periodically.  None now)  All other systems reviewed and are negative.    Physical Exam Updated Vital Signs BP (!) 143/79 (BP Location: Left Arm)   Pulse 86   Temp 98.5 F (36.9 C) (Oral)   Resp 18   Ht  (1.88 m)   Wt 97.5 kg (215 lb)   SpO2 94%   BMI 27.60 kg/m   Physical Exam  Constitutional: He is oriented to person, place, and time. He appears well-developed and well-nourished.  HENT:  Head: Normocephalic and atraumatic.  Eyes: Pupils are equal, round, and reactive to light. EOM are normal.  Neck: Normal range of motion. Neck supple.  Cardiovascular: Normal rate, regular rhythm, intact distal pulses and normal pulses.  Pulmonary/Chest: Effort normal and breath sounds normal.  Abdominal: Soft. Bowel sounds are normal.  Musculoskeletal: Normal range of motion.       Right lower  leg: Normal.       Left lower leg: Normal.  Neurological: He is alert and oriented to person, place, and time.  Skin: Skin is warm and dry. Capillary refill takes less than 2 seconds.  Psychiatric: He has a normal mood and affect. His behavior is normal.  Nursing note and vitals reviewed.    ED Treatments / Results  Labs (all labs ordered are listed, but only abnormal results are displayed) Labs Reviewed  COMPREHENSIVE METABOLIC PANEL - Abnormal; Notable for the following components:      Result Value   Glucose, Bld 136 (*)    All other components within normal limits  CBC WITH DIFFERENTIAL/PLATELET - Abnormal; Notable for the following components:   WBC 10.9 (*)    RBC 3.74 (*)    Hemoglobin 12.9 (*)    MCV 104.5 (*)    MCH 34.5 (*)    Neutro Abs 8.8 (*)    All other components within normal limits  TROPONIN I    EKG EKG Interpretation  Date/Time:  Thursday Jul 08 2017 11:31:00 EDT Ventricular Rate:  87 PR Interval:    QRS Duration: 70 QT Interval:  346 QTC Calculation: 417 R Axis:   75 Text Interpretation:  Sinus rhythm Borderline T wave abnormalities No significant change since last tracing Confirmed by Jacalyn Lefevre [phone]) on 07/08/2017 12:35:01 PM   Radiology Dg Chest 2 View  Result Date: 07/08/2017 CLINICAL DATA:  Chest pain EXAM: CHEST - 2 VIEW COMPARISON:  November 17, 2014 FINDINGS: Lungs are clear. Heart size and pulmonary vascularity are normal. No adenopathy. No pneumothorax. No bone lesions. IMPRESSION: No edema or consolidation. Electronically Signed   By: Bretta Bang III M.D.   On: 07/08/2017 12:28    Procedures Procedures (including critical care time)  Medications Ordered in ED Medications  ketorolac (TORADOL) 30 MG/ML injection 30 mg (30 mg Intravenous Given 07/08/17 1202)  dexamethasone (DECADRON) injection 10 mg (10 mg Intravenous Given 07/08/17 1202)     Initial Impression / Assessment and Plan / ED Course  I have reviewed the triage  vital signs and the nursing notes.  Pertinent labs & imaging results that were available during my care of the patient were reviewed by me and considered in my medical decision making (see chart for details).    Pt is feeling better after meds.  Sx are not c/w CAD.  Heart score of 1.  Pt is stable for d/c.  Return if worse.  F/u with pcp.  Final Clinical Impressions(s) / ED Diagnoses   Final diagnoses:  Atypical chest pain  Cervical radiculopathy  Thoracic radiculopathy    ED Discharge Orders        Ordered    predniSONE (STERAPRED UNI-PAK 21 TAB) 10 MG (21) TBPK tablet  Daily     07/08/17 1248    ketorolac (TORADOL) 10 MG tablet  Every 6 hours PRN     07/08/17 1248       Jacalyn Lefevre, MD 07/08/17 1251

## 2017-07-08 NOTE — ED Triage Notes (Signed)
Pt reports he has had chest pain for the past couple of months.  Reports he also has 2 slipped discs in his thoracic spine and was told it could cause the pain to wrap around his chest.  Pt says the past 2 weeks he has had pain in left arm as well.  Denies any sob, n/v.

## 2017-09-29 ENCOUNTER — Encounter: Payer: Self-pay | Admitting: Physical Medicine & Rehabilitation

## 2017-10-19 ENCOUNTER — Encounter: Payer: Medicare HMO | Attending: Physical Medicine & Rehabilitation

## 2017-10-19 ENCOUNTER — Ambulatory Visit: Payer: Medicare HMO | Admitting: Physical Medicine & Rehabilitation

## 2017-10-19 ENCOUNTER — Encounter: Payer: Self-pay | Admitting: Physical Medicine & Rehabilitation

## 2017-10-19 VITALS — BP 143/83 | HR 103 | Ht 74.0 in | Wt 228.0 lb

## 2017-10-19 DIAGNOSIS — G8928 Other chronic postprocedural pain: Secondary | ICD-10-CM | POA: Insufficient documentation

## 2017-10-19 DIAGNOSIS — F17210 Nicotine dependence, cigarettes, uncomplicated: Secondary | ICD-10-CM | POA: Diagnosis not present

## 2017-10-19 DIAGNOSIS — M5126 Other intervertebral disc displacement, lumbar region: Secondary | ICD-10-CM

## 2017-10-19 DIAGNOSIS — Z981 Arthrodesis status: Secondary | ICD-10-CM | POA: Insufficient documentation

## 2017-10-19 DIAGNOSIS — Z5181 Encounter for therapeutic drug level monitoring: Secondary | ICD-10-CM

## 2017-10-19 DIAGNOSIS — M47814 Spondylosis without myelopathy or radiculopathy, thoracic region: Secondary | ICD-10-CM | POA: Diagnosis not present

## 2017-10-19 MED ORDER — GABAPENTIN 300 MG PO CAPS: 300.0000 mg | ORAL_CAPSULE | Freq: Three times a day (TID) | ORAL | 5 refills | Status: AC

## 2017-10-19 NOTE — Progress Notes (Signed)
Subjective:    Patient ID: Bryan Robles, male    DOB: Sep 15, 1980, 37 y.o.   MRN: [id number]  HPI CC:  Low back pain Back pain since 1996, had football injury in 9th grade Tried steroid dose pack but ended up with surgery on lumbar, Has had L4-5 and L5-S1 hemilaminectomy followed by L4-5 L5-S1 fusion 2017 Dr Lovell Sheehan  Has numbness in legs on occasion  Had atypical CP ED visit, which was attributed to thoracic DDD and cervical DDD  Had been on Hydrocodone 10mg  BID up until June 2019 Post op was on oxycodone  Post op PT after 4th back surgery, which helped for 1.2mo  Used to take gabapentin which was helpful, in conjunction with his narcotic analgesic.  No sig relief with flexeril which caused drowsiness Patient remains independent with all his self-care and mobility.  He has not worked for a number of years.  He is considering going back to school so he can do a sedentary job. Pain Inventory Average Pain 7 Pain Right Now 8 My pain is sharp, burning, stabbing, tingling and aching  In the last 24 hours, has pain interfered with the following? General activity 7 Relation with others 5 Enjoyment of life 7 What TIME of day is your pain at its worst? night Sleep (in general) Poor  Pain is worse with: bending, sitting and standing Pain improves with: heat/ice and medication Relief from Meds: 7  Mobility use a cane ability to climb steps?  yes do you drive?  yes  Function disabled: date disabled . I need assistance with the following:  meal prep and household duties  Neuro/Psych numbness tingling  Prior Studies new patient CLINICAL DATA:  Chronic LBP radiating into both legs x 4 months, NKI, hx of 5 lumbar surgeries  EXAM: MRI LUMBAR SPINE WITHOUT AND WITH CONTRAST  TECHNIQUE: Multiplanar and multiecho pulse sequences of the lumbar spine were obtained without and with intravenous contrast.  CONTRAST:  18mL MULTIHANCE GADOBENATE DIMEGLUMINE 529 MG/ML IV  SOLN  COMPARISON:  05/18/2014  FINDINGS: Segmentation:  Standard.  Alignment:  Physiologic.  Vertebrae:  No fracture, evidence of discitis, or bone lesion.  Conus medullaris: Extends to the L1 level and appears normal.  Paraspinal and other soft tissues: Negative  Disc levels:  Disc spaces: Mild disc desiccation at L4-5 with endplate reactive changes at L4. Mild degenerative disc disease with disc height loss at L5-S1.  T12-L1: No significant disc bulge. No evidence of neural foraminal stenosis. No central canal stenosis.  L1-L2: No significant disc bulge. No evidence of neural foraminal stenosis. No central canal stenosis.  L2-L3: No significant disc bulge. No evidence of neural foraminal stenosis. No central canal stenosis.  L3-L4: No significant disc bulge. No evidence of neural foraminal stenosis. No central canal stenosis.  L4-L5: Tiny left paracentral disc protrusion abutting the left intraspinal L5 nerve root. No evidence of neural foraminal stenosis. No central canal stenosis.  L5-S1: Broad-based disc bulge with a left paracentral disc protrusion abutting the left intraspinal S1 nerve root. Mild right foraminal narrowing. No evidence of neural foraminal stenosis. No central canal stenosis.  IMPRESSION: 1. At L4-5 there is a tiny left paracentral disc protrusion abutting the left intraspinal L5 nerve root. 2. At L5-S1 there is a broad-based disc bulge with a left paracentral disc protrusion abutting the left intraspinal S1 nerve root. Mild right foraminal narrowing.   Electronically Signed   By: Elige Ko   On: 08/09/2015 14:54 RADIOLOGY REPORT*  Clinical Data:  Back pain.  T6 and T7 herniated discs.  MRI THORACIC SPINE WITHOUT CONTRAST  Technique:  Multiplanar and multiecho pulse sequences of the thoracic spine were obtained without intravenous contrast.  Comparison: 11/05/2004.  Findings: The patient is tilted in the  scanner.  Spinal alignment is anatomic.  Thoracic cord shows no intramedullary lesions or edema.  C7-T1 through T4-T5 appear normal.  T5-T6:  Small to moderate-sized right paracentral disc protrusion contacts and flattens the right ventral aspect of the thoracic cord.  Disc protrusion appears slightly larger when compared to the prior exam.  T6-T7:  Left paracentral disc protrusion is unchanged.  This contacts and indents the left ventral aspect of the thoracic cord.  T7-T8:  Larger right paracentral disc protrusion with increased indentation of the right side of the ventral thoracic cord.  There is now moderate central stenosis and more cord deformity.  No radiating cord edema is identified.  T9-T10:  Tiny left paracentral protrusion without stenosis.  T10-T11 through T12-L1 levels appear normal.  IMPRESSION: Mild progression of thoracic spondylosis at T5-T6 and T7-T8.  T7-T8 is the most severe level, with indentation of the right ventral thoracic cord increasing from prior examination.  No cord compression or edema.  Original Report Authenticated By: Andreas NewportGEOFFREY LAMKE, M.D. Physicians involved in your care new patient   Family History  Problem Relation Age of Onset  . Hypertension Mother    Social History   Socioeconomic History  . Marital status: Single    Spouse name: Not on file  . Number of children: Not on file  . Years of education: Not on file  . Highest education level: Not on file  Occupational History  . Not on file  Social Needs  . Financial resource strain: Not on file  . Food insecurity:    Worry: Not on file    Inability: Not on file  . Transportation needs:    Medical: Not on file    Non-medical: Not on file  Tobacco Use  . Smoking status: Current Every Day Smoker    Packs/day: 1.00    Years: 12.00    Pack years: 12.00    Types: Cigarettes  . Smokeless tobacco: Former NeurosurgeonUser    Quit date: 07/27/2013  Substance and Sexual Activity  .  Alcohol use: Yes    Alcohol/week: 0.0 standard drinks    Comment: socially  . Drug use: No  . Sexual activity: Not on file  Lifestyle  . Physical activity:    Days per week: Not on file    Minutes per session: Not on file  . Stress: Not on file  Relationships  . Social connections:    Talks on phone: Not on file    Gets together: Not on file    Attends religious service: Not on file    Active member of club or organization: Not on file    Attends meetings of clubs or organizations: Not on file    Relationship status: Not on file  Other Topics Concern  . Not on file  Social History Narrative  . Not on file   Past Surgical History:  Procedure Laterality Date  . BACK SURGERY    . WOUND EXPLORATION Right 11/13/2014   Procedure: WOUND EXPLORATION of Right Arm and Ligation of superficial vein.;  Surgeon: Nada LibmanVance W Brabham, MD;  [facility];  Service: Vascular;  Laterality: Right;   Past Medical History:  Diagnosis Date  . Arthritis   . Headache    migraines  .  Hypertension    There were no vitals taken for this visit.  Opioid Risk Score:   Fall Risk Score:  `1  Depression screen PHQ 2/9  No flowsheet data found.   Review of Systems  Constitutional: Positive for unexpected weight change.  HENT: Negative.   Eyes: Negative.   Respiratory: Negative.   Cardiovascular: Negative.   Gastrointestinal: Negative.   Endocrine: Negative.   Musculoskeletal: Positive for arthralgias, back pain, gait problem and myalgias.  Skin: Negative.   Allergic/Immunologic: Negative.   Neurological: Positive for numbness.  Hematological: Negative.   Psychiatric/Behavioral: Negative.   All other systems reviewed and are negative.      Objective:   Physical Exam  Constitutional: He is oriented to person, place, and time.  Cardiovascular: Normal rate and regular rhythm. Exam reveals no friction rub.  No murmur heard. Pulmonary/Chest: Effort normal and breath sounds normal. No stridor.  No respiratory distress.  Abdominal: Soft. Bowel sounds are normal. He exhibits no distension. There is no tenderness.  Musculoskeletal:  Tenderness to palpation at the operative incision in the lumbar spine this is around L4-L5 area.  No pain above L4 no pain in the PSIS area Hip range of motion normal.   Neurological: He is alert and oriented to person, place, and time. He has normal strength. He displays no atrophy. A sensory deficit is present. Coordination and gait normal.  Reflex Scores:      Patellar reflexes are 2+ on the right side and 1+ on the left side.      Achilles reflexes are 2+ on the right side and 1+ on the left side. Motor strength is 5/5 bilateral deltoid, bicep, tricep, grip, hip flexor, knee extensor, ankle dorsiflexor.     Decreased pp sensation Left L4-5 S1 dermatomes     Assessment & Plan:  1.  Lumbar postlaminectomy syndrome with chronic postoperative pain.  He has no significant radicular symptomatology. He has an opioid risk of 1 Will need urine drug screen If urine drug screen is consistent, would start hydrocodone 10 mg twice daily Have given him back exercises.  Patient is to complete on a daily basis. I have encouraged him to be more active he may well with aquatic exercise. He may benefit from neuropsychology evaluation given he is having some problems with his adjusting to disability. Nurse practitioner visit in 1 month, will also restart gabapentin

## 2017-10-19 NOTE — Patient Instructions (Addendum)

## 2017-10-24 LAB — TOXASSURE SELECT 13 (MW), URINE

## 2017-10-27 ENCOUNTER — Telehealth: Payer: Self-pay | Admitting: *Deleted

## 2017-10-27 NOTE — Telephone Encounter (Signed)
Please inform pt of non narcotic

## 2017-10-27 NOTE — Telephone Encounter (Signed)
Contacted patient and informed. He claims he does not do street drugs so there is no way it could be in his system.  He claims the oxycodone was prescribed.  I informed of non-narcotic status.  His phone kept breaking up and I had to ask him to repeat. Our call was disconnected

## 2017-10-27 NOTE — Telephone Encounter (Signed)
Urine drug screen is inconsistent.  It is positive for the metabolite of cocaine Benzoylecgonine, and unprescribed oxycodone.

## 2017-11-04 NOTE — Telephone Encounter (Signed)
Appointment with Riley LamEunice NP has been cancelled and changed to Dr Wynn BankerKirsteins.

## 2017-11-19 ENCOUNTER — Encounter: Payer: Medicare HMO | Attending: Physical Medicine & Rehabilitation

## 2017-11-19 ENCOUNTER — Other Ambulatory Visit: Payer: Self-pay

## 2017-11-19 ENCOUNTER — Ambulatory Visit: Payer: Medicare HMO | Admitting: Physical Medicine & Rehabilitation

## 2017-11-19 ENCOUNTER — Encounter: Payer: Medicare HMO | Admitting: Registered Nurse

## 2017-11-19 ENCOUNTER — Encounter: Payer: Self-pay | Admitting: Physical Medicine & Rehabilitation

## 2017-11-19 VITALS — BP 150/87 | HR 90 | Ht 74.0 in | Wt 228.0 lb

## 2017-11-19 DIAGNOSIS — F17210 Nicotine dependence, cigarettes, uncomplicated: Secondary | ICD-10-CM | POA: Insufficient documentation

## 2017-11-19 DIAGNOSIS — M961 Postlaminectomy syndrome, not elsewhere classified: Secondary | ICD-10-CM | POA: Diagnosis not present

## 2017-11-19 DIAGNOSIS — M5126 Other intervertebral disc displacement, lumbar region: Secondary | ICD-10-CM | POA: Diagnosis not present

## 2017-11-19 DIAGNOSIS — Z981 Arthrodesis status: Secondary | ICD-10-CM | POA: Diagnosis not present

## 2017-11-19 DIAGNOSIS — Z5181 Encounter for therapeutic drug level monitoring: Secondary | ICD-10-CM | POA: Diagnosis not present

## 2017-11-19 DIAGNOSIS — G8928 Other chronic postprocedural pain: Secondary | ICD-10-CM

## 2017-11-19 DIAGNOSIS — M47814 Spondylosis without myelopathy or radiculopathy, thoracic region: Secondary | ICD-10-CM | POA: Insufficient documentation

## 2017-11-19 NOTE — Progress Notes (Signed)
Please inform pt of non narcotic   Subjective:    Patient ID: Bryan Robles, male    DOB: [birth year], 37 y.o.   MRN: [id number]  HPI 37 year old male with history of chronic low back pain.  He has undergone L4-5 L5-S1 fusion by Dr. Lovell Robles in 2017 He states that his pain is in the low back area and points to an area right around his incision. He has no new weakness or numbness in the lower limbs.  No bowel or bladder dysfunction. He can ambulate without an assistive device He is independent with self-care and mobility Pain Inventory Average Pain 7 Pain Right Now 7 My pain is sharp and burning  In the last 24 hours, has pain interfered with the following? General activity 6 Relation with others 6 Enjoyment of life 6 What TIME of day is your pain at its worst? evening Sleep (in general) Poor  Pain is worse with: sitting and standing Pain improves with: heat/ice Relief from Meds: 8  Mobility walk with assistance use a cane how many minutes can you walk? 30 ability to climb steps?  no  Function Do you have any goals in this area?  no  Neuro/Psych weakness numbness tingling trouble walking spasms confusion depression anxiety suicidal thoughts  Prior Studies Any changes since last visit?  no  Physicians involved in your care Any changes since last visit?  no   Family History  Problem Relation Age of Onset  . Hypertension Mother    Social History   Socioeconomic History  . Marital status: Single    Spouse name: Not on file  . Number of children: Not on file  . Years of education: Not on file  . Highest education level: Not on file  Occupational History  . Not on file  Social Needs  . Financial resource strain: Not on file  . Food insecurity:    Worry: Not on file    Inability: Not on file  . Transportation needs:    Medical: Not on file    Non-medical: Not on file  Tobacco Use  . Smoking status: Current Every Day Smoker    Packs/day: 1.00    Years:  12.00    Pack years: 12.00    Types: Cigarettes  . Smokeless tobacco: Former NeurosurgeonUser    Quit date: 07/27/2013  Substance and Sexual Activity  . Alcohol use: Yes    Alcohol/week: 0.0 standard drinks    Comment: socially  . Drug use: No  . Sexual activity: Not on file  Lifestyle  . Physical activity:    Days per week: Not on file    Minutes per session: Not on file  . Stress: Not on file  Relationships  . Social connections:    Talks on phone: Not on file    Gets together: Not on file    Attends religious service: Not on file    Active member of club or organization: Not on file    Attends meetings of clubs or organizations: Not on file    Relationship status: Not on file  Other Topics Concern  . Not on file  Social History Narrative  . Not on file   Past Surgical History:  Procedure Laterality Date  . BACK SURGERY    . WOUND EXPLORATION Right 11/13/2014   Procedure: WOUND EXPLORATION of Right Arm and Ligation of superficial vein.;  Surgeon: Bryan LibmanVance W Brabham, MD;  [facility];  Service: Vascular;  Laterality: Right;   Past Medical  History:  Diagnosis Date  . Arthritis   . Headache    migraines  . Hypertension    BP (!) 150/87   Pulse 90   Ht 6\' 2"  (1.88 m)   Wt 228 lb (103.4 kg)   SpO2 92%   BMI 29.27 kg/m   Opioid Risk Score:   Fall Risk Score:  `1  Depression screen PHQ 2/9  Depression screen PHQ 2/9 11/19/2017  Decreased Interest 1  Down, Depressed, Hopeless 1  PHQ - 2 Score 2    Review of Systems  Constitutional: Negative.   HENT: Negative.   Eyes: Negative.   Respiratory: Negative.   Cardiovascular: Negative.   Gastrointestinal: Negative.   Endocrine: Negative.   Genitourinary: Negative.   Musculoskeletal: Negative.   Skin: Negative.   Allergic/Immunologic: Negative.   Neurological: Negative.   Hematological: Negative.   Psychiatric/Behavioral: Negative.   All other systems reviewed and are negative.      Objective:   Physical Exam    Constitutional: He appears well-developed and well-nourished. No distress.  HENT:  Head: Normocephalic and atraumatic.  Eyes: Pupils are equal, round, and reactive to light. EOM are normal.  Musculoskeletal:  Lumbar range of motion reduced to 50% flexion extension Well-healed lumbar incision mild tenderness bilateral paraspinals along the incision  Skin: Skin is warm and dry. He is not diaphoretic.  Psychiatric: He has a normal mood and affect.  Nursing note and vitals reviewed.   Motor strength 5/5 bilateral hip flexor knee extensor ankle dorsiflexor Negative straight leg raise Mood and affect appropriate       Assessment & Plan:  1.  Lumbar postlaminectomy syndrome with chronic low back pain.  We discussed his urine drug screen with positive cocaine metabolites as well as oxycodone At last visit he did not report taking oxycodone Patient disputes the validity of the urine drug screen results.  He will be discussing this with [facility]. We discussed that I will consider the UDS results as valid and therefore not prescribe narcotic analgesics. He can follow-up with me in 6 months I have given him lumbar exercises and advised him to follow-up with neuropsychology to address the emotional components of his chronic pain.

## 2018-01-17 DIAGNOSIS — H5213 Myopia, bilateral: Secondary | ICD-10-CM | POA: Diagnosis not present

## 2018-01-17 DIAGNOSIS — H5211 Myopia, right eye: Secondary | ICD-10-CM | POA: Diagnosis not present

## 2018-01-17 DIAGNOSIS — H52223 Regular astigmatism, bilateral: Secondary | ICD-10-CM | POA: Diagnosis not present

## 2018-01-17 DIAGNOSIS — H52222 Regular astigmatism, left eye: Secondary | ICD-10-CM | POA: Diagnosis not present

## 2018-02-05 IMAGING — RF DG LUMBAR SPINE 2-3V
1 series · 2 of 2 positions shown · non-contrast
Comparison: None.

CLINICAL DATA: PLIF L4-L5 and L5-S1.

EXAM:
LUMBAR SPINE - 2-3 VIEW; DG C-ARM 61-120 MIN

[Series 1: run · 2 of 2 slices shown]
[im 1/2]
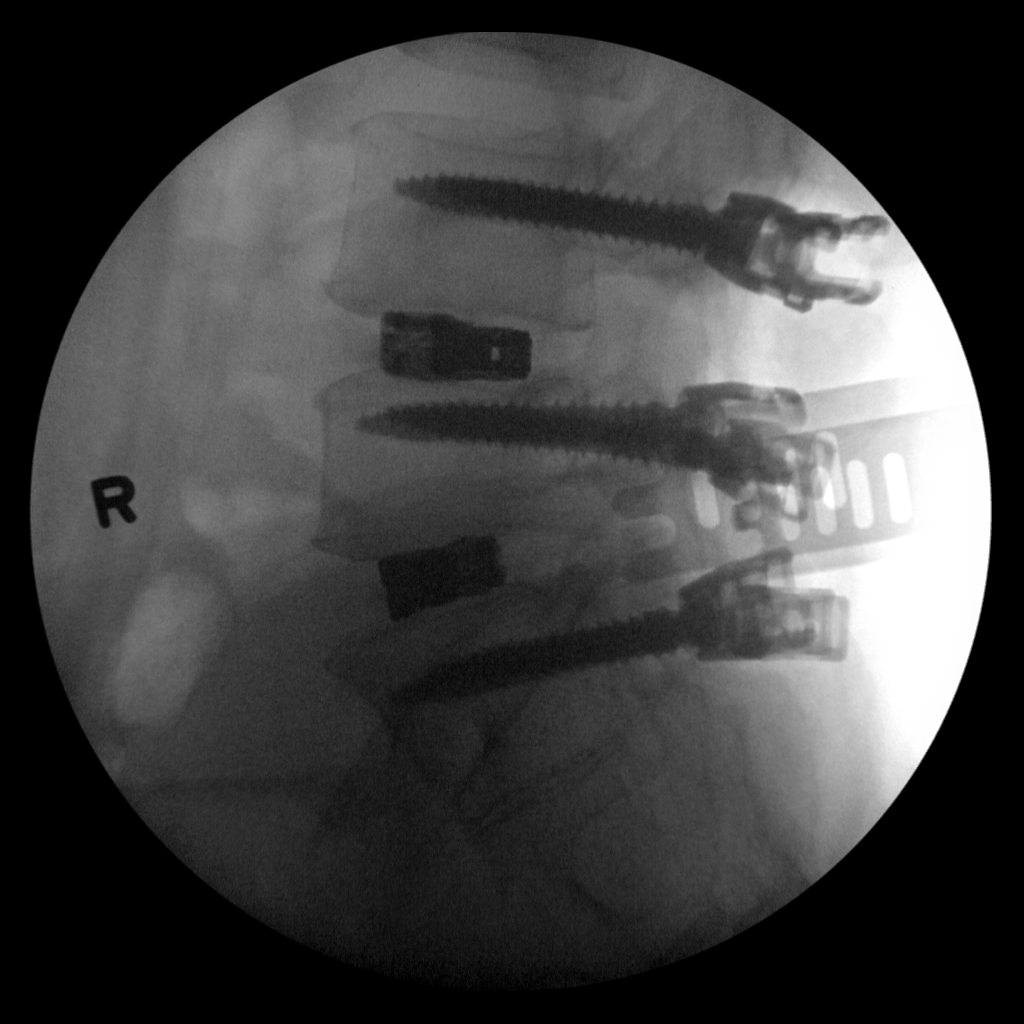
[im 2/2]
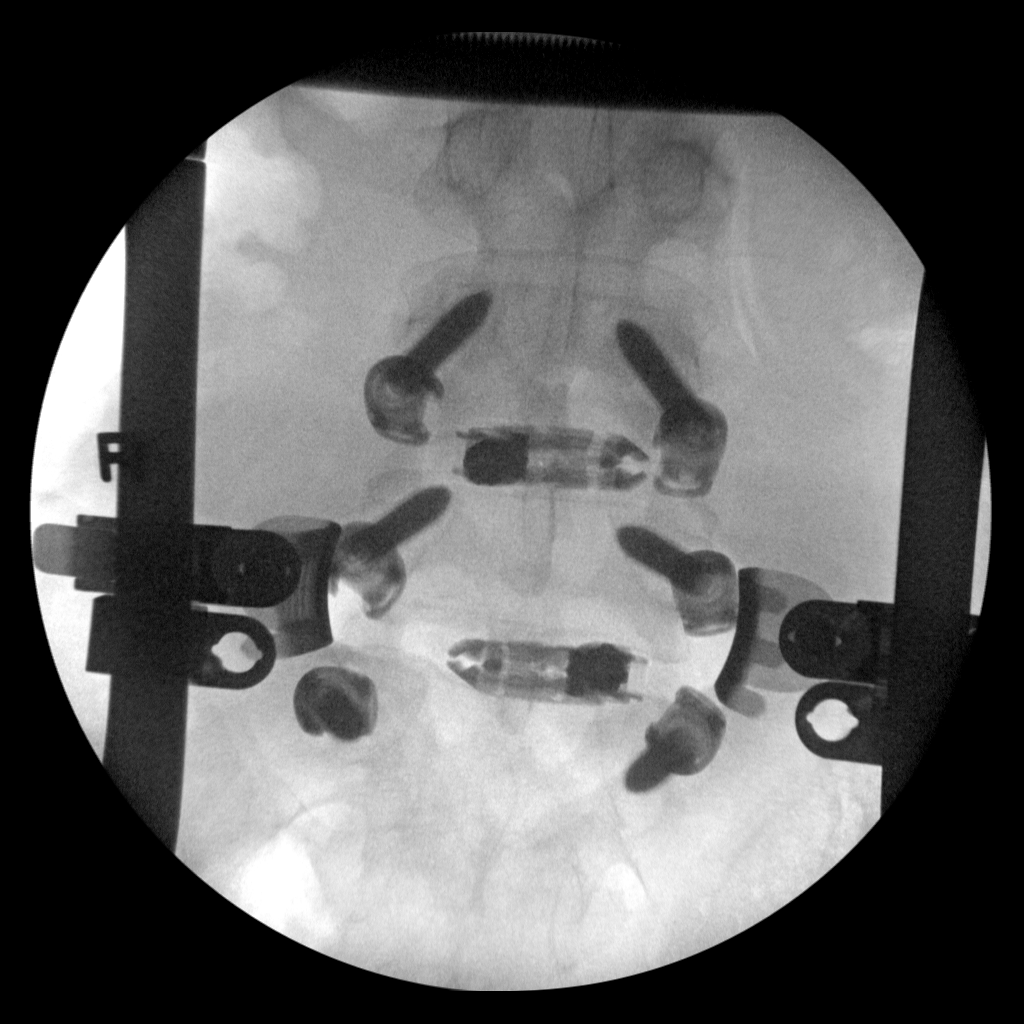

[2 of 2 positions shown; findings below may reference images not displayed]

FINDINGS: Two fluoroscopic spot images obtained in the operating room in
frontal and lateral projections demonstrate intrapedicular screws at
L4, L5, and S1. Interbody spacers are in place at L4-L5 and L5-S1.
Alignment is maintained. Fluoroscopy time 0 minutes 33 seconds.
IMPRESSION: Intraoperative fluoroscopic spot images PLIF L4-L5 and L5-S1.

## 2018-02-05 IMAGING — CR DG LUMBAR SPINE 1V
1 series · 1 of 1 positions shown · non-contrast
Comparison: Lumbar MRI 08/09/2015

CLINICAL DATA: PLIF L4-S1

EXAM:
LUMBAR SPINE - 1 VIEW

[lateral]
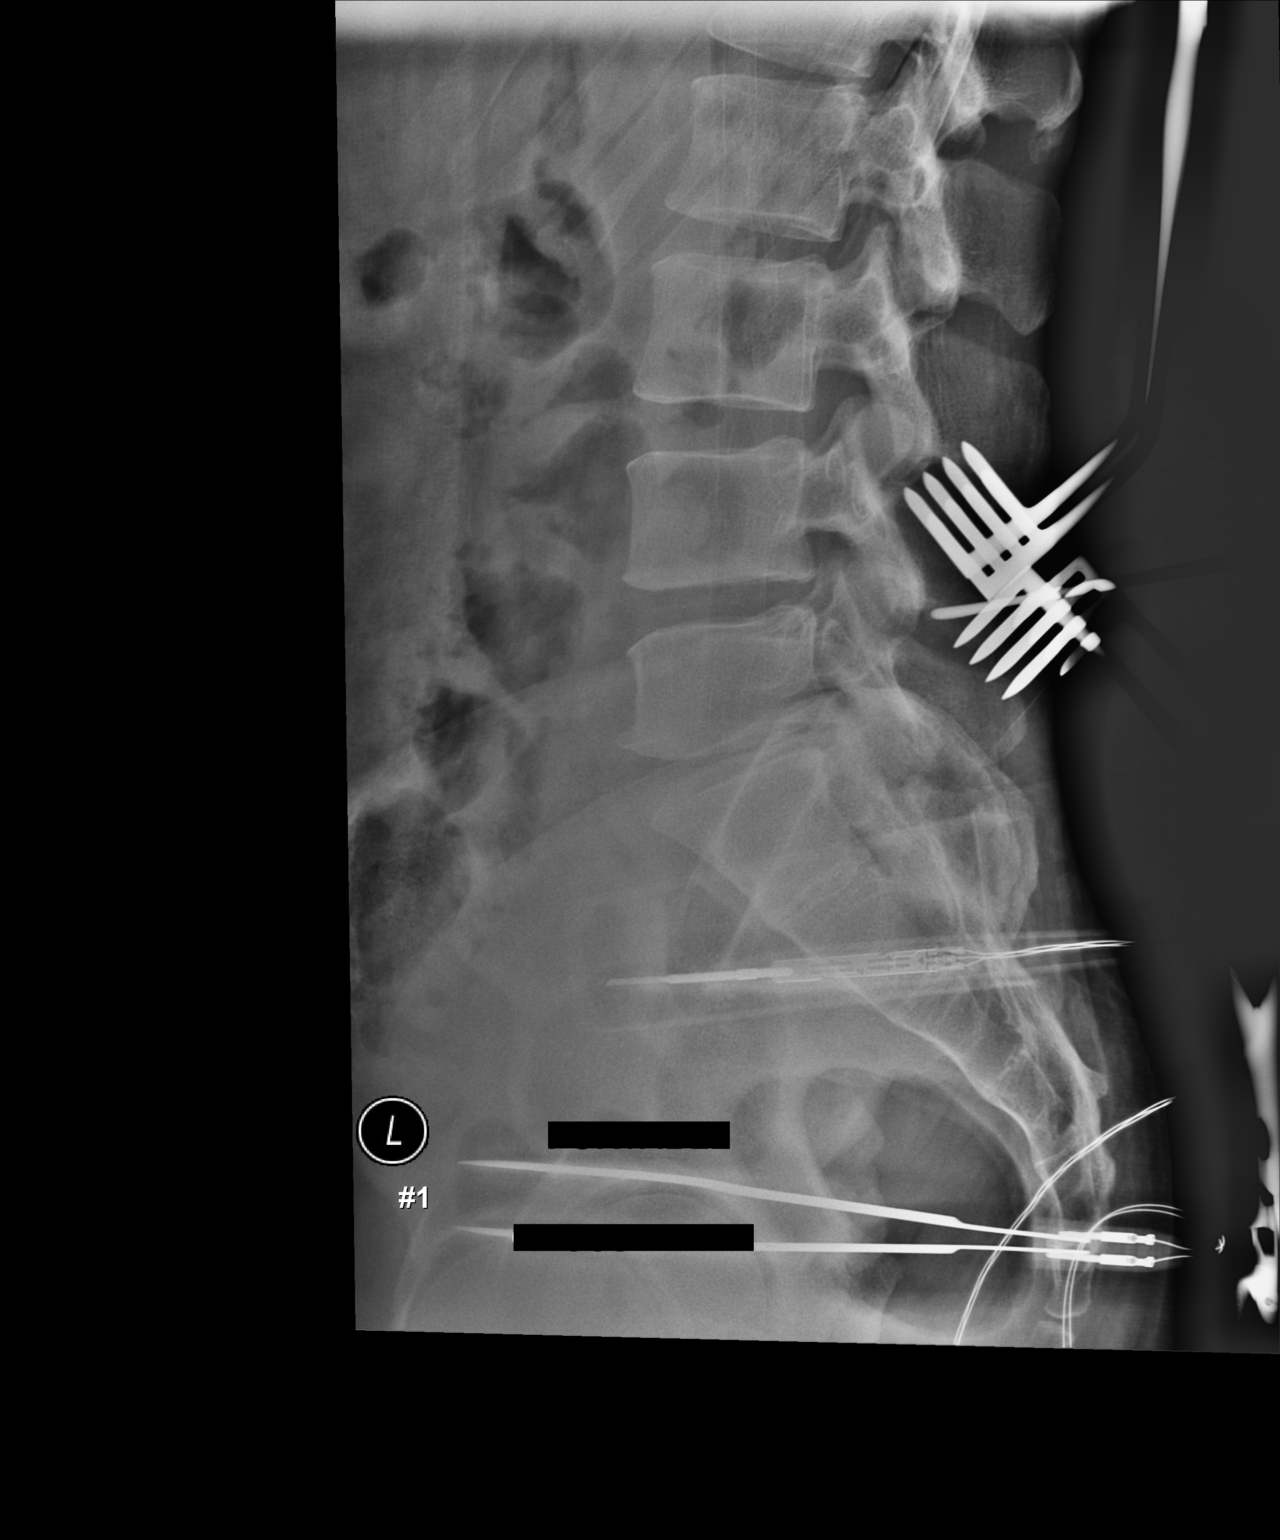

[1 of 1 positions shown; findings below may reference images not displayed]

FINDINGS: Using the numbering convention of the comparison lumbar MRI, a blunt
tip probe is posterior to the L5 vertebral body.
IMPRESSION: Intraoperative radiograph as above.

## 2018-05-23 ENCOUNTER — Encounter: Payer: Medicare HMO | Attending: Physical Medicine & Rehabilitation

## 2018-05-23 ENCOUNTER — Ambulatory Visit: Payer: Medicare HMO | Admitting: Physical Medicine & Rehabilitation

## 2018-10-21 ENCOUNTER — Other Ambulatory Visit: Payer: Self-pay | Admitting: Physical Medicine & Rehabilitation

## 2019-03-20 DIAGNOSIS — R309 Painful micturition, unspecified: Secondary | ICD-10-CM | POA: Diagnosis not present

## 2019-03-20 DIAGNOSIS — R360 Urethral discharge without blood: Secondary | ICD-10-CM | POA: Diagnosis not present

## 2019-03-29 DIAGNOSIS — I10 Essential (primary) hypertension: Secondary | ICD-10-CM | POA: Diagnosis not present

## 2019-03-29 DIAGNOSIS — G43909 Migraine, unspecified, not intractable, without status migrainosus: Secondary | ICD-10-CM | POA: Diagnosis not present

## 2019-06-27 DIAGNOSIS — I10 Essential (primary) hypertension: Secondary | ICD-10-CM | POA: Diagnosis not present

## 2019-06-27 DIAGNOSIS — E785 Hyperlipidemia, unspecified: Secondary | ICD-10-CM | POA: Diagnosis not present

## 2019-06-30 DIAGNOSIS — E785 Hyperlipidemia, unspecified: Secondary | ICD-10-CM | POA: Diagnosis not present

## 2019-06-30 DIAGNOSIS — I10 Essential (primary) hypertension: Secondary | ICD-10-CM | POA: Diagnosis not present

## 2019-06-30 DIAGNOSIS — G43909 Migraine, unspecified, not intractable, without status migrainosus: Secondary | ICD-10-CM | POA: Diagnosis not present

## 2019-07-22 IMAGING — CR DG CHEST 2V
2 series · 2 of 2 positions shown · non-contrast
Comparison: November 17, 2014

CLINICAL DATA: Chest pain

EXAM:
CHEST - 2 VIEW

[w chest pa]
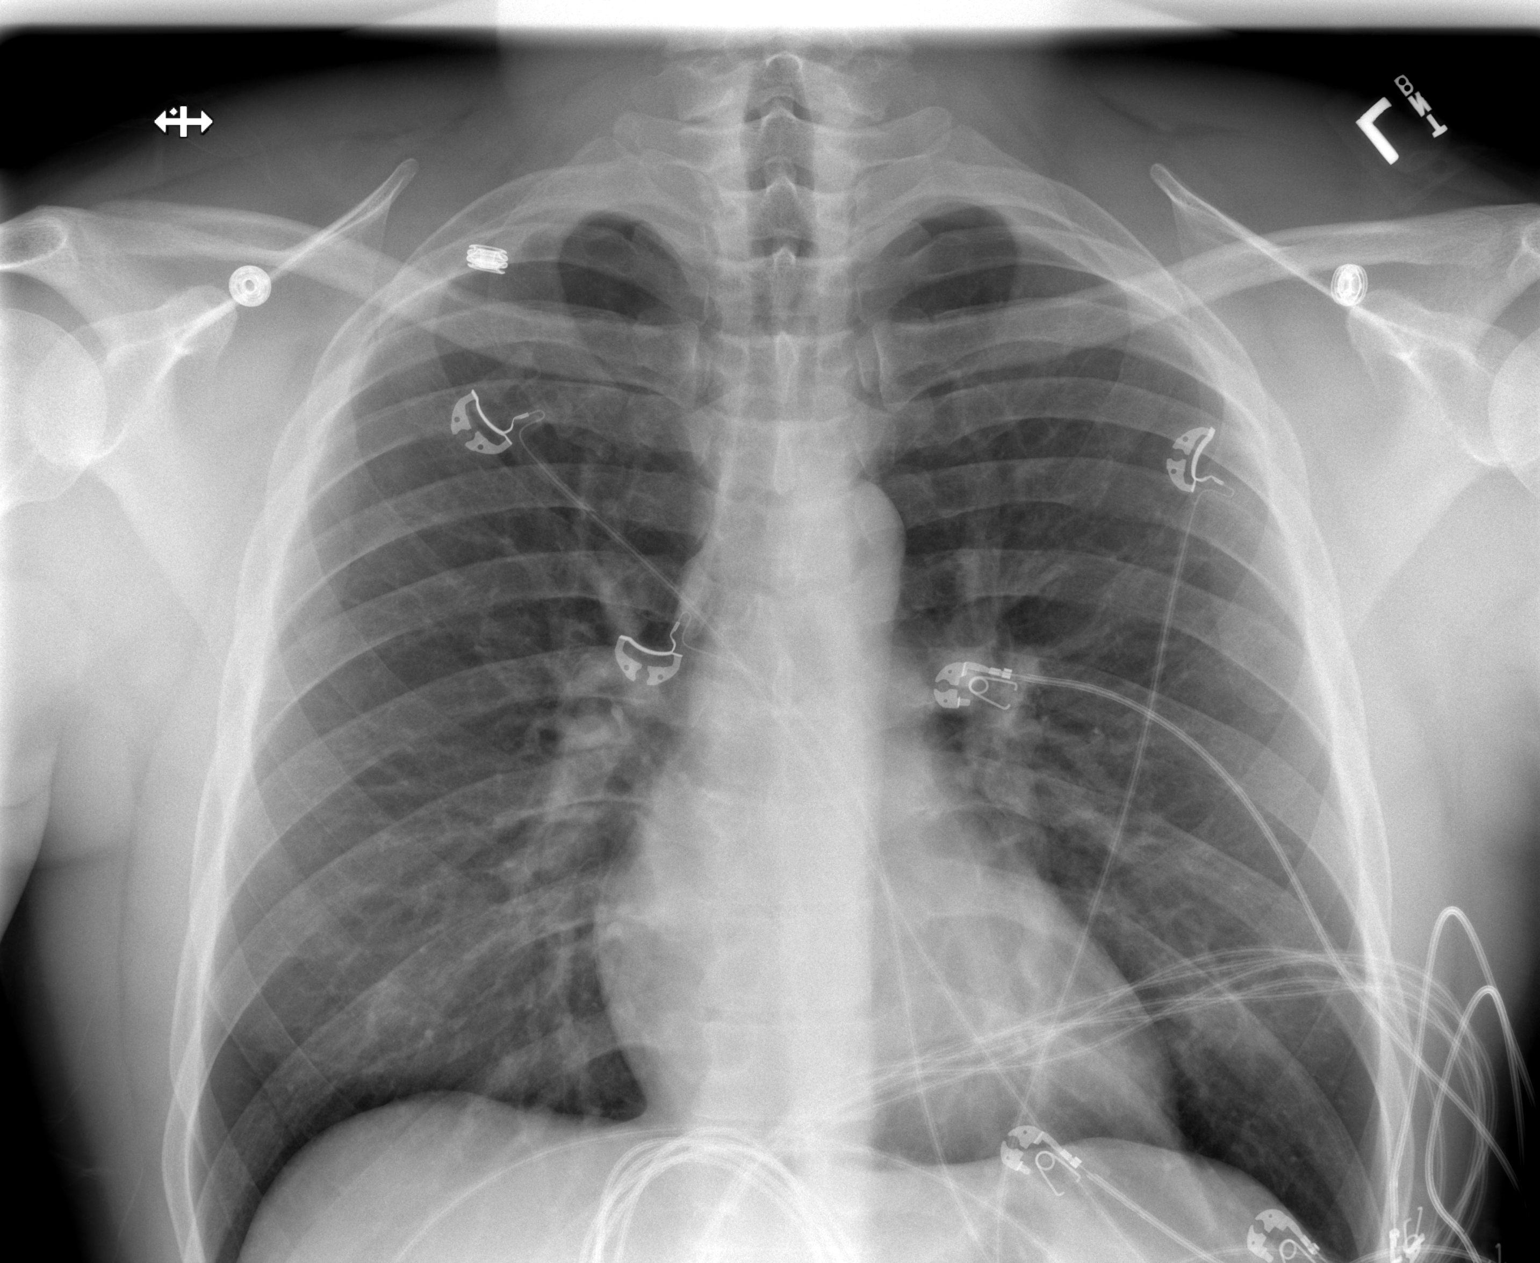

[w chest lat]
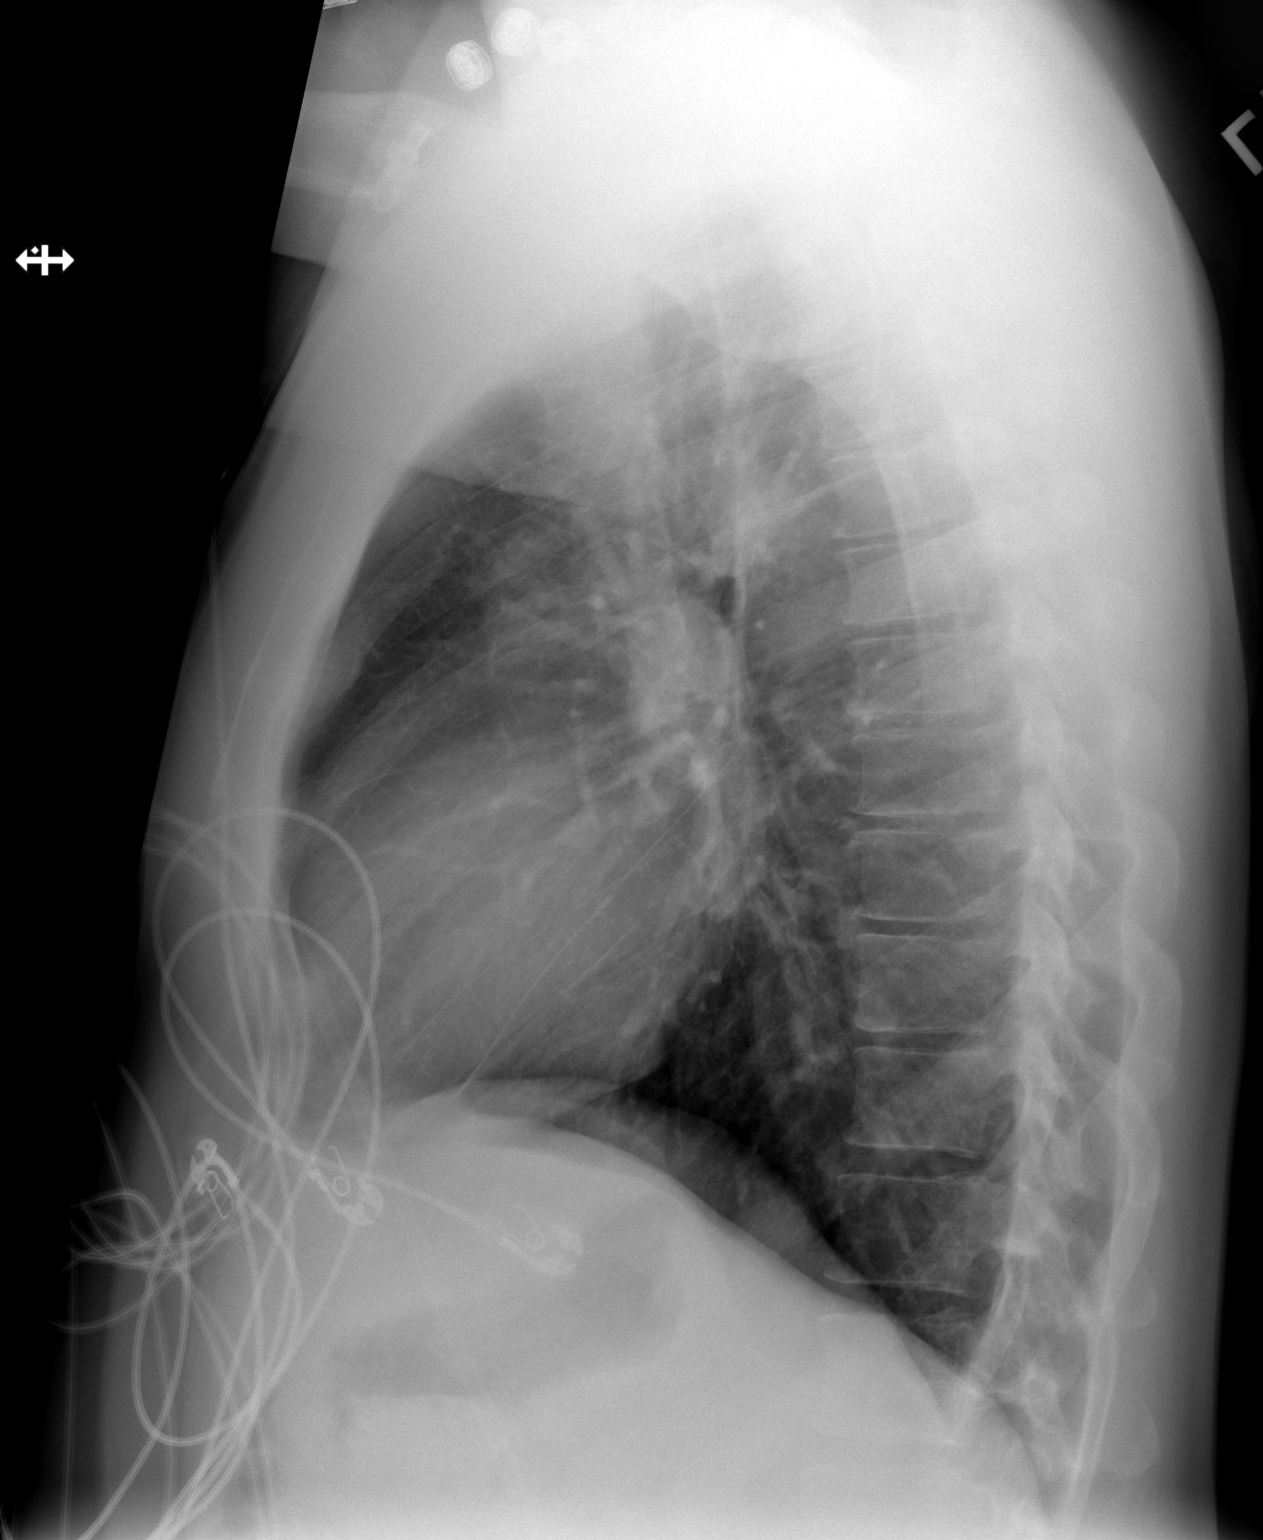

[2 of 2 positions shown; findings below may reference images not displayed]

FINDINGS: Lungs are clear. Heart size and pulmonary vascularity are normal. No
adenopathy. No pneumothorax. No bone lesions.
IMPRESSION: No edema or consolidation.

## 2019-10-30 DIAGNOSIS — K5900 Constipation, unspecified: Secondary | ICD-10-CM | POA: Diagnosis not present

## 2019-10-30 DIAGNOSIS — I10 Essential (primary) hypertension: Secondary | ICD-10-CM | POA: Diagnosis not present

## 2019-11-30 DIAGNOSIS — I10 Essential (primary) hypertension: Secondary | ICD-10-CM | POA: Diagnosis not present

## 2020-01-19 DIAGNOSIS — H52223 Regular astigmatism, bilateral: Secondary | ICD-10-CM | POA: Diagnosis not present

## 2020-02-06 DIAGNOSIS — B9689 Other specified bacterial agents as the cause of diseases classified elsewhere: Secondary | ICD-10-CM | POA: Diagnosis not present

## 2020-02-06 DIAGNOSIS — L0202 Furuncle of face: Secondary | ICD-10-CM | POA: Diagnosis not present

## 2021-02-10 DIAGNOSIS — Z23 Encounter for immunization: Secondary | ICD-10-CM | POA: Diagnosis not present

## 2021-03-07 DIAGNOSIS — E785 Hyperlipidemia, unspecified: Secondary | ICD-10-CM | POA: Diagnosis not present

## 2021-03-07 DIAGNOSIS — I10 Essential (primary) hypertension: Secondary | ICD-10-CM | POA: Diagnosis not present

## 2021-08-21 DIAGNOSIS — E785 Hyperlipidemia, unspecified: Secondary | ICD-10-CM | POA: Diagnosis not present

## 2021-08-21 DIAGNOSIS — Z79899 Other long term (current) drug therapy: Secondary | ICD-10-CM | POA: Diagnosis not present

## 2021-08-21 DIAGNOSIS — Z125 Encounter for screening for malignant neoplasm of prostate: Secondary | ICD-10-CM | POA: Diagnosis not present

## 2021-08-21 DIAGNOSIS — G43909 Migraine, unspecified, not intractable, without status migrainosus: Secondary | ICD-10-CM | POA: Diagnosis not present

## 2021-08-21 DIAGNOSIS — I10 Essential (primary) hypertension: Secondary | ICD-10-CM | POA: Diagnosis not present

## 2021-08-21 DIAGNOSIS — R739 Hyperglycemia, unspecified: Secondary | ICD-10-CM | POA: Diagnosis not present

## 2021-08-28 DIAGNOSIS — R739 Hyperglycemia, unspecified: Secondary | ICD-10-CM | POA: Diagnosis not present

## 2021-08-28 DIAGNOSIS — R7309 Other abnormal glucose: Secondary | ICD-10-CM | POA: Diagnosis not present

## 2021-08-28 DIAGNOSIS — I10 Essential (primary) hypertension: Secondary | ICD-10-CM | POA: Diagnosis not present

## 2021-08-28 DIAGNOSIS — E785 Hyperlipidemia, unspecified: Secondary | ICD-10-CM | POA: Diagnosis not present

## 2021-08-28 DIAGNOSIS — Z6831 Body mass index (BMI) 31.0-31.9, adult: Secondary | ICD-10-CM | POA: Diagnosis not present

## 2021-08-28 DIAGNOSIS — M5136 Other intervertebral disc degeneration, lumbar region: Secondary | ICD-10-CM | POA: Diagnosis not present

## 2021-08-28 DIAGNOSIS — G43001 Migraine without aura, not intractable, with status migrainosus: Secondary | ICD-10-CM | POA: Diagnosis not present

## 2021-10-21 DIAGNOSIS — Z0100 Encounter for examination of eyes and vision without abnormal findings: Secondary | ICD-10-CM | POA: Diagnosis not present

## 2021-10-21 DIAGNOSIS — E119 Type 2 diabetes mellitus without complications: Secondary | ICD-10-CM | POA: Diagnosis not present

## 2021-11-28 DIAGNOSIS — I10 Essential (primary) hypertension: Secondary | ICD-10-CM | POA: Diagnosis not present

## 2021-11-28 DIAGNOSIS — Z79899 Other long term (current) drug therapy: Secondary | ICD-10-CM | POA: Diagnosis not present

## 2021-11-28 DIAGNOSIS — R739 Hyperglycemia, unspecified: Secondary | ICD-10-CM | POA: Diagnosis not present

## 2021-11-28 DIAGNOSIS — E785 Hyperlipidemia, unspecified: Secondary | ICD-10-CM | POA: Diagnosis not present

## 2021-12-04 DIAGNOSIS — E1169 Type 2 diabetes mellitus with other specified complication: Secondary | ICD-10-CM | POA: Diagnosis not present

## 2021-12-04 DIAGNOSIS — Z23 Encounter for immunization: Secondary | ICD-10-CM | POA: Diagnosis not present

## 2021-12-04 DIAGNOSIS — I10 Essential (primary) hypertension: Secondary | ICD-10-CM | POA: Diagnosis not present

## 2021-12-04 DIAGNOSIS — R739 Hyperglycemia, unspecified: Secondary | ICD-10-CM | POA: Diagnosis not present

## 2022-03-04 DIAGNOSIS — I10 Essential (primary) hypertension: Secondary | ICD-10-CM | POA: Diagnosis not present

## 2022-03-04 DIAGNOSIS — Z79899 Other long term (current) drug therapy: Secondary | ICD-10-CM | POA: Diagnosis not present

## 2022-03-04 DIAGNOSIS — E118 Type 2 diabetes mellitus with unspecified complications: Secondary | ICD-10-CM | POA: Diagnosis not present

## 2022-03-04 DIAGNOSIS — E785 Hyperlipidemia, unspecified: Secondary | ICD-10-CM | POA: Diagnosis not present

## 2022-03-11 DIAGNOSIS — E1169 Type 2 diabetes mellitus with other specified complication: Secondary | ICD-10-CM | POA: Diagnosis not present

## 2022-03-11 DIAGNOSIS — I10 Essential (primary) hypertension: Secondary | ICD-10-CM | POA: Diagnosis not present

## 2022-03-11 DIAGNOSIS — E785 Hyperlipidemia, unspecified: Secondary | ICD-10-CM | POA: Diagnosis not present

## 2022-06-10 DIAGNOSIS — I10 Essential (primary) hypertension: Secondary | ICD-10-CM | POA: Diagnosis not present

## 2022-06-10 DIAGNOSIS — G43909 Migraine, unspecified, not intractable, without status migrainosus: Secondary | ICD-10-CM | POA: Diagnosis not present

## 2022-06-10 DIAGNOSIS — E785 Hyperlipidemia, unspecified: Secondary | ICD-10-CM | POA: Diagnosis not present

## 2022-06-10 DIAGNOSIS — R739 Hyperglycemia, unspecified: Secondary | ICD-10-CM | POA: Diagnosis not present

## 2022-06-10 DIAGNOSIS — Z79899 Other long term (current) drug therapy: Secondary | ICD-10-CM | POA: Diagnosis not present

## 2022-06-15 DIAGNOSIS — I10 Essential (primary) hypertension: Secondary | ICD-10-CM | POA: Diagnosis not present

## 2022-06-15 DIAGNOSIS — E1169 Type 2 diabetes mellitus with other specified complication: Secondary | ICD-10-CM | POA: Diagnosis not present

## 2022-06-15 DIAGNOSIS — E785 Hyperlipidemia, unspecified: Secondary | ICD-10-CM | POA: Diagnosis not present

## 2022-09-09 DIAGNOSIS — Z125 Encounter for screening for malignant neoplasm of prostate: Secondary | ICD-10-CM | POA: Diagnosis not present

## 2022-09-09 DIAGNOSIS — E785 Hyperlipidemia, unspecified: Secondary | ICD-10-CM | POA: Diagnosis not present

## 2022-09-09 DIAGNOSIS — E118 Type 2 diabetes mellitus with unspecified complications: Secondary | ICD-10-CM | POA: Diagnosis not present

## 2022-09-09 DIAGNOSIS — I10 Essential (primary) hypertension: Secondary | ICD-10-CM | POA: Diagnosis not present

## 2022-09-09 DIAGNOSIS — Z79899 Other long term (current) drug therapy: Secondary | ICD-10-CM | POA: Diagnosis not present

## 2022-09-15 ENCOUNTER — Other Ambulatory Visit (HOSPITAL_COMMUNITY): Payer: Self-pay | Admitting: Internal Medicine

## 2022-09-15 DIAGNOSIS — M5090 Cervical disc disorder, unspecified, unspecified cervical region: Secondary | ICD-10-CM

## 2022-09-15 DIAGNOSIS — D7589 Other specified diseases of blood and blood-forming organs: Secondary | ICD-10-CM | POA: Diagnosis not present

## 2022-09-15 DIAGNOSIS — I10 Essential (primary) hypertension: Secondary | ICD-10-CM | POA: Diagnosis not present

## 2022-09-15 DIAGNOSIS — E785 Hyperlipidemia, unspecified: Secondary | ICD-10-CM | POA: Diagnosis not present

## 2022-09-15 DIAGNOSIS — E1169 Type 2 diabetes mellitus with other specified complication: Secondary | ICD-10-CM | POA: Diagnosis not present

## 2022-09-18 ENCOUNTER — Ambulatory Visit (HOSPITAL_BASED_OUTPATIENT_CLINIC_OR_DEPARTMENT_OTHER)
Admission: RE | Admit: 2022-09-18 | Discharge: 2022-09-18 | Disposition: A | Discharge status: Home / Self Care | Payer: Medicare HMO | Source: Ambulatory Visit | Attending: Internal Medicine | Admitting: Internal Medicine

## 2022-09-18 DIAGNOSIS — M4802 Spinal stenosis, cervical region: Secondary | ICD-10-CM | POA: Diagnosis not present

## 2022-09-18 DIAGNOSIS — M47812 Spondylosis without myelopathy or radiculopathy, cervical region: Secondary | ICD-10-CM | POA: Diagnosis not present

## 2022-09-18 DIAGNOSIS — M47813 Spondylosis without myelopathy or radiculopathy, cervicothoracic region: Secondary | ICD-10-CM | POA: Diagnosis not present

## 2022-09-18 DIAGNOSIS — M5090 Cervical disc disorder, unspecified, unspecified cervical region: Secondary | ICD-10-CM | POA: Diagnosis not present

## 2022-09-18 DIAGNOSIS — M47811 Spondylosis without myelopathy or radiculopathy, occipito-atlanto-axial region: Secondary | ICD-10-CM | POA: Diagnosis not present

## 2022-11-05 DIAGNOSIS — M5090 Cervical disc disorder, unspecified, unspecified cervical region: Secondary | ICD-10-CM | POA: Diagnosis not present

## 2022-11-12 DIAGNOSIS — M47812 Spondylosis without myelopathy or radiculopathy, cervical region: Secondary | ICD-10-CM | POA: Diagnosis not present

## 2022-12-14 DIAGNOSIS — Z79899 Other long term (current) drug therapy: Secondary | ICD-10-CM | POA: Diagnosis not present

## 2022-12-14 DIAGNOSIS — E118 Type 2 diabetes mellitus with unspecified complications: Secondary | ICD-10-CM | POA: Diagnosis not present

## 2022-12-14 DIAGNOSIS — M5090 Cervical disc disorder, unspecified, unspecified cervical region: Secondary | ICD-10-CM | POA: Diagnosis not present

## 2022-12-14 DIAGNOSIS — D7589 Other specified diseases of blood and blood-forming organs: Secondary | ICD-10-CM | POA: Diagnosis not present

## 2022-12-14 DIAGNOSIS — I10 Essential (primary) hypertension: Secondary | ICD-10-CM | POA: Diagnosis not present

## 2022-12-14 DIAGNOSIS — E785 Hyperlipidemia, unspecified: Secondary | ICD-10-CM | POA: Diagnosis not present

## 2022-12-15 DIAGNOSIS — Z79899 Other long term (current) drug therapy: Secondary | ICD-10-CM | POA: Diagnosis not present

## 2022-12-15 DIAGNOSIS — M47812 Spondylosis without myelopathy or radiculopathy, cervical region: Secondary | ICD-10-CM | POA: Diagnosis not present

## 2022-12-15 DIAGNOSIS — Z79891 Long term (current) use of opiate analgesic: Secondary | ICD-10-CM | POA: Diagnosis not present

## 2022-12-15 DIAGNOSIS — G894 Chronic pain syndrome: Secondary | ICD-10-CM | POA: Diagnosis not present

## 2022-12-15 DIAGNOSIS — M546 Pain in thoracic spine: Secondary | ICD-10-CM | POA: Diagnosis not present

## 2022-12-17 DIAGNOSIS — E785 Hyperlipidemia, unspecified: Secondary | ICD-10-CM | POA: Diagnosis not present

## 2022-12-17 DIAGNOSIS — I10 Essential (primary) hypertension: Secondary | ICD-10-CM | POA: Diagnosis not present

## 2022-12-17 DIAGNOSIS — E1169 Type 2 diabetes mellitus with other specified complication: Secondary | ICD-10-CM | POA: Diagnosis not present

## 2023-02-16 DIAGNOSIS — M47812 Spondylosis without myelopathy or radiculopathy, cervical region: Secondary | ICD-10-CM | POA: Diagnosis not present

## 2023-03-22 DIAGNOSIS — E118 Type 2 diabetes mellitus with unspecified complications: Secondary | ICD-10-CM | POA: Diagnosis not present

## 2023-03-22 DIAGNOSIS — Z79899 Other long term (current) drug therapy: Secondary | ICD-10-CM | POA: Diagnosis not present

## 2023-03-22 DIAGNOSIS — I10 Essential (primary) hypertension: Secondary | ICD-10-CM | POA: Diagnosis not present

## 2023-03-22 DIAGNOSIS — E785 Hyperlipidemia, unspecified: Secondary | ICD-10-CM | POA: Diagnosis not present

## 2023-03-29 DIAGNOSIS — E785 Hyperlipidemia, unspecified: Secondary | ICD-10-CM | POA: Diagnosis not present

## 2023-03-29 DIAGNOSIS — E1169 Type 2 diabetes mellitus with other specified complication: Secondary | ICD-10-CM | POA: Diagnosis not present

## 2023-04-20 DIAGNOSIS — M47812 Spondylosis without myelopathy or radiculopathy, cervical region: Secondary | ICD-10-CM | POA: Diagnosis not present

## 2023-06-22 DIAGNOSIS — M47812 Spondylosis without myelopathy or radiculopathy, cervical region: Secondary | ICD-10-CM | POA: Diagnosis not present

## 2023-06-22 DIAGNOSIS — E118 Type 2 diabetes mellitus with unspecified complications: Secondary | ICD-10-CM | POA: Diagnosis not present

## 2023-06-29 DIAGNOSIS — E1169 Type 2 diabetes mellitus with other specified complication: Secondary | ICD-10-CM | POA: Diagnosis not present

## 2023-06-29 DIAGNOSIS — I10 Essential (primary) hypertension: Secondary | ICD-10-CM | POA: Diagnosis not present

## 2023-07-30 DIAGNOSIS — L658 Other specified nonscarring hair loss: Secondary | ICD-10-CM | POA: Diagnosis not present

## 2023-07-30 DIAGNOSIS — L818 Other specified disorders of pigmentation: Secondary | ICD-10-CM | POA: Diagnosis not present

## 2023-08-24 DIAGNOSIS — M5000 Cervical disc disorder with myelopathy, unspecified cervical region: Secondary | ICD-10-CM | POA: Diagnosis not present

## 2023-10-26 DIAGNOSIS — M5000 Cervical disc disorder with myelopathy, unspecified cervical region: Secondary | ICD-10-CM | POA: Diagnosis not present

## 2023-10-28 DIAGNOSIS — E118 Type 2 diabetes mellitus with unspecified complications: Secondary | ICD-10-CM | POA: Diagnosis not present

## 2023-11-01 DIAGNOSIS — Z79899 Other long term (current) drug therapy: Secondary | ICD-10-CM | POA: Diagnosis not present

## 2023-11-01 DIAGNOSIS — I10 Essential (primary) hypertension: Secondary | ICD-10-CM | POA: Diagnosis not present

## 2023-11-01 DIAGNOSIS — N529 Male erectile dysfunction, unspecified: Secondary | ICD-10-CM | POA: Diagnosis not present

## 2023-11-01 DIAGNOSIS — E1169 Type 2 diabetes mellitus with other specified complication: Secondary | ICD-10-CM | POA: Diagnosis not present

## 2023-12-15 ENCOUNTER — Encounter: Payer: Self-pay | Admitting: *Deleted

## 2023-12-15 NOTE — Progress Notes (Signed)
 Bryan Robles                                          MRN: [id number]   12/15/2023   The [facility] Quality Team Specialist reviewed this patient medical record for the purposes of chart review for care gap closure. The following were reviewed: chart review for care gap closure-kidney health evaluation for diabetes:eGFR  and uACR.    [facility] Quality Team
